# Patient Record
Sex: Female | Born: 1945 | Hispanic: No | Marital: Married | Smoking: Never smoker
Health system: Southern US, Community
[De-identification: ages and names within clinical notes are randomized; demographics above are authoritative.]

## PROBLEM LIST (undated history)

## (undated) DIAGNOSIS — F419 Anxiety disorder, unspecified: Secondary | ICD-10-CM

## (undated) DIAGNOSIS — E079 Disorder of thyroid, unspecified: Secondary | ICD-10-CM

## (undated) DIAGNOSIS — I10 Essential (primary) hypertension: Secondary | ICD-10-CM

---

## 1998-12-14 ENCOUNTER — Other Ambulatory Visit: Admission: RE | Admit: 1998-12-14 | Discharge: 1998-12-14 | Payer: Self-pay | Admitting: Obstetrics and Gynecology

## 2001-07-30 ENCOUNTER — Other Ambulatory Visit: Admission: RE | Admit: 2001-07-30 | Discharge: 2001-07-30 | Payer: Self-pay | Admitting: Obstetrics and Gynecology

## 2001-08-12 ENCOUNTER — Encounter: Admission: RE | Admit: 2001-08-12 | Discharge: 2001-08-12 | Payer: Self-pay | Admitting: Obstetrics and Gynecology

## 2001-08-12 ENCOUNTER — Encounter: Payer: Self-pay | Admitting: Obstetrics and Gynecology

## 2013-01-04 ENCOUNTER — Emergency Department: Payer: Self-pay | Admitting: Emergency Medicine

## 2013-01-04 LAB — CBC WITH DIFFERENTIAL/PLATELET
Basophil #: 0 10*3/uL (ref 0.0–0.1)
Basophil %: 0.6 %
Eosinophil #: 0.1 10*3/uL (ref 0.0–0.7)
Eosinophil %: 1.2 %
HCT: 38.6 % (ref 35.0–47.0)
HGB: 13.4 g/dL (ref 12.0–16.0)
Lymphocyte #: 0.9 10*3/uL — ABNORMAL LOW (ref 1.0–3.6)
Lymphocyte %: 16 %
MCH: 34.7 pg — ABNORMAL HIGH (ref 26.0–34.0)
MCHC: 34.7 g/dL (ref 32.0–36.0)
MCV: 100 fL (ref 80–100)
Monocyte #: 0.5 x10 3/mm (ref 0.2–0.9)
Monocyte %: 9.1 %
Neutrophil #: 4.1 10*3/uL (ref 1.4–6.5)
Neutrophil %: 73.1 %
Platelet: 131 10*3/uL — ABNORMAL LOW (ref 150–440)
RBC: 3.87 10*6/uL (ref 3.80–5.20)
RDW: 13.1 % (ref 11.5–14.5)
WBC: 5.6 10*3/uL (ref 3.6–11.0)

## 2013-01-04 LAB — COMPREHENSIVE METABOLIC PANEL
Albumin: 3.7 g/dL (ref 3.4–5.0)
Alkaline Phosphatase: 73 U/L
Anion Gap: 6 — ABNORMAL LOW (ref 7–16)
BUN: 17 mg/dL (ref 7–18)
Bilirubin,Total: 0.7 mg/dL (ref 0.2–1.0)
Calcium, Total: 10.4 mg/dL — ABNORMAL HIGH (ref 8.5–10.1)
Chloride: 101 mmol/L (ref 98–107)
Co2: 27 mmol/L (ref 21–32)
Creatinine: 0.89 mg/dL (ref 0.60–1.30)
EGFR (African American): >60
EGFR (Non-African Amer.): >60
Glucose: 147 mg/dL — ABNORMAL HIGH (ref 65–99)
Osmolality: 272 (ref 275–301)
Potassium: 3.9 mmol/L (ref 3.5–5.1)
SGOT(AST): 32 U/L (ref 15–37)
SGPT (ALT): 36 U/L (ref 12–78)
Sodium: 134 mmol/L — ABNORMAL LOW (ref 136–145)
Total Protein: 8.3 g/dL — ABNORMAL HIGH (ref 6.4–8.2)

## 2013-01-04 LAB — SEDIMENTATION RATE: Erythrocyte Sed Rate: 69 mm/hr — ABNORMAL HIGH (ref 0–30)

## 2013-01-04 LAB — URIC ACID: Uric Acid: 5 mg/dL (ref 2.6–6.0)

## 2013-01-08 ENCOUNTER — Emergency Department: Payer: Self-pay | Admitting: Emergency Medicine

## 2013-01-08 LAB — CBC
HCT: 40.9 % (ref 35.0–47.0)
HGB: 14.3 g/dL (ref 12.0–16.0)
MCH: 34.7 pg — ABNORMAL HIGH (ref 26.0–34.0)
MCHC: 35.1 g/dL (ref 32.0–36.0)
MCV: 99 fL (ref 80–100)
Platelet: 185 10*3/uL (ref 150–440)
RBC: 4.14 10*6/uL (ref 3.80–5.20)
RDW: 13 % (ref 11.5–14.5)
WBC: 6.2 10*3/uL (ref 3.6–11.0)

## 2013-01-08 LAB — COMPREHENSIVE METABOLIC PANEL
Albumin: 4.1 g/dL (ref 3.4–5.0)
Alkaline Phosphatase: 88 U/L
Anion Gap: 3 — ABNORMAL LOW (ref 7–16)
BUN: 18 mg/dL (ref 7–18)
Bilirubin,Total: 0.5 mg/dL (ref 0.2–1.0)
Calcium, Total: 10.9 mg/dL — ABNORMAL HIGH (ref 8.5–10.1)
Chloride: 104 mmol/L (ref 98–107)
Co2: 28 mmol/L (ref 21–32)
Creatinine: 0.78 mg/dL (ref 0.60–1.30)
EGFR (African American): >60
EGFR (Non-African Amer.): >60
Glucose: 135 mg/dL — ABNORMAL HIGH (ref 65–99)
Osmolality: 274 (ref 275–301)
Potassium: 4.3 mmol/L (ref 3.5–5.1)
SGOT(AST): 62 U/L — ABNORMAL HIGH (ref 15–37)
SGPT (ALT): 61 U/L (ref 12–78)
Sodium: 135 mmol/L — ABNORMAL LOW (ref 136–145)
Total Protein: 9.3 g/dL — ABNORMAL HIGH (ref 6.4–8.2)

## 2013-01-08 LAB — TSH: Thyroid Stimulating Horm: 26.2 u[IU]/mL — ABNORMAL HIGH

## 2014-04-05 DIAGNOSIS — M545 Low back pain: Secondary | ICD-10-CM | POA: Diagnosis not present

## 2014-04-05 DIAGNOSIS — S3992XA Unspecified injury of lower back, initial encounter: Secondary | ICD-10-CM | POA: Diagnosis not present

## 2014-11-25 DIAGNOSIS — Z139 Encounter for screening, unspecified: Secondary | ICD-10-CM | POA: Diagnosis not present

## 2014-11-25 DIAGNOSIS — E119 Type 2 diabetes mellitus without complications: Secondary | ICD-10-CM | POA: Diagnosis not present

## 2014-11-25 DIAGNOSIS — E039 Hypothyroidism, unspecified: Secondary | ICD-10-CM | POA: Diagnosis not present

## 2015-01-19 ENCOUNTER — Encounter (HOSPITAL_COMMUNITY): Payer: Self-pay | Admitting: Emergency Medicine

## 2015-01-19 ENCOUNTER — Emergency Department (HOSPITAL_COMMUNITY)
Admission: EM | Admit: 2015-01-19 | Discharge: 2015-01-19 | Discharge status: Against Medical Advice | Payer: Medicare PPO | Attending: Emergency Medicine | Admitting: Emergency Medicine

## 2015-01-19 DIAGNOSIS — F329 Major depressive disorder, single episode, unspecified: Secondary | ICD-10-CM | POA: Insufficient documentation

## 2015-01-19 DIAGNOSIS — Z8639 Personal history of other endocrine, nutritional and metabolic disease: Secondary | ICD-10-CM | POA: Diagnosis not present

## 2015-01-19 DIAGNOSIS — I10 Essential (primary) hypertension: Secondary | ICD-10-CM | POA: Diagnosis not present

## 2015-01-19 DIAGNOSIS — F1010 Alcohol abuse, uncomplicated: Secondary | ICD-10-CM | POA: Diagnosis not present

## 2015-01-19 DIAGNOSIS — F32A Depression, unspecified: Secondary | ICD-10-CM

## 2015-01-19 HISTORY — DX: Essential (primary) hypertension: I10

## 2015-01-19 HISTORY — DX: Disorder of thyroid, unspecified: E07.9

## 2015-01-19 LAB — COMPREHENSIVE METABOLIC PANEL
ALK PHOS: 63 U/L (ref 38–126)
ALT: 79 U/L — AB (ref 14–54)
AST: 125 U/L — AB (ref 15–41)
Albumin: 4.1 g/dL (ref 3.5–5.0)
Anion gap: 10 (ref 5–15)
BUN: 13 mg/dL (ref 6–20)
CALCIUM: 11 mg/dL — AB (ref 8.9–10.3)
CHLORIDE: 96 mmol/L — AB (ref 101–111)
CO2: 27 mmol/L (ref 22–32)
CREATININE: 0.85 mg/dL (ref 0.44–1.00)
GFR calc non Af Amer: >60 mL/min (ref >60)
Glucose, Bld: 188 mg/dL — ABNORMAL HIGH (ref 65–99)
Potassium: 4.1 mmol/L (ref 3.5–5.1)
SODIUM: 133 mmol/L — AB (ref 135–145)
Total Bilirubin: 0.6 mg/dL (ref 0.3–1.2)
Total Protein: 7.8 g/dL (ref 6.5–8.1)

## 2015-01-19 LAB — CBC
HEMATOCRIT: 42.5 % (ref 36.0–46.0)
Hemoglobin: 14.8 g/dL (ref 12.0–15.0)
MCH: 35.7 pg — AB (ref 26.0–34.0)
MCHC: 34.8 g/dL (ref 30.0–36.0)
MCV: 102.7 fL — AB (ref 78.0–100.0)
Platelets: 122 10*3/uL — ABNORMAL LOW (ref 150–400)
RBC: 4.14 MIL/uL (ref 3.87–5.11)
RDW: 12.8 % (ref 11.5–15.5)
WBC: 4.6 10*3/uL (ref 4.0–10.5)

## 2015-01-19 LAB — ETHANOL: Alcohol, Ethyl (B): <5 mg/dL (ref <5)

## 2015-01-19 MED ORDER — THIAMINE HCL 100 MG/ML IJ SOLN: 100.0000 mg | Freq: Every day | INTRAMUSCULAR | Status: DC

## 2015-01-19 MED ORDER — LORAZEPAM 1 MG PO TABS: 0.0000 mg | ORAL_TABLET | Freq: Two times a day (BID) | ORAL | Status: DC

## 2015-01-19 MED ORDER — VITAMIN B-1 100 MG PO TABS: 100.0000 mg | ORAL_TABLET | Freq: Every day | ORAL | Status: DC

## 2015-01-19 MED ORDER — LORAZEPAM 1 MG PO TABS: 0.0000 mg | ORAL_TABLET | Freq: Four times a day (QID) | ORAL | Status: DC

## 2015-01-19 NOTE — BH Assessment (Signed)
Tele Assessment Note   Whitney BellowsCathy G Wageman is an 69 y.o. female.   Diagnosis:   Past Medical History:  Past Medical History  Diagnosis Date  . Hypertension   . Thyroid disease     History reviewed. No pertinent past surgical history.  Family History: History reviewed. No pertinent family history.  Social History:  reports that she has never smoked. She does not have any smokeless tobacco history on file. She reports that she drinks alcohol. She reports that she does not use illicit drugs.  Additional Social History:  Alcohol / Drug Use History of alcohol / drug use?: Yes Longest period of sobriety (when/how long): Can't remember  Negative Consequences of Use: Financial, Personal relationships, Work / Programmer, multimediachool, Armed forces operational officerLegal Withdrawal Symptoms: Fever / Chills, Irritability, Patient aware of relationship between substance abuse and physical/medical complications, Sweats Substance #1 Name of Substance 1: Alcohol 1 - Age of First Use: 19 1 - Amount (size/oz): half of a bottle of vodka daily 1 - Frequency: Daily  1 - Duration: Last couple of years  1 - Last Use / Amount: Yesterday she drank a half a bottle of vodka   CIWA: CIWA-Ar BP: 140/75 mmHg Pulse Rate: 70 Nausea and Vomiting: mild nausea with no vomiting Tactile Disturbances: none Tremor: not visible, but can be felt fingertip to fingertip Auditory Disturbances: not present Paroxysmal Sweats: no sweat visible Visual Disturbances: not present Anxiety: moderately anxious, or guarded, so anxiety is inferred Headache, Fullness in Head: very mild Agitation: normal activity Orientation and Clouding of Sensorium: oriented and can do serial additions CIWA-Ar Total: 7 COWS:    PATIENT STRENGTHS: (choose at least two) Average or above average intelligence Capable of independent living Communication skills Supportive family/friends  Allergies: No Known Allergies  Home Medications:  (Not in a hospital admission)  OB/GYN Status:  No  LMP recorded.  General Assessment Data [facility] Assessment: In system Is this a Tele or Face-to-Face Assessment?: Tele Assessment Is this an Initial Assessment or a Re-assessment for this encounter?: Initial Assessment Marital status: Married [place] Whitney Gilbert Is patient pregnant?: No Pregnancy Status: No Living Arrangements: Spouse/significant other Can pt return to current living arrangement?: Yes Admission Status: Voluntary Is patient capable of signing voluntary admission?: Yes Referral Source: Self/Family/Friend Insurance type: [employer]  Medical Screening Exam [facility] ONLY) Medical Exam completed: Yes  Crisis Care Plan Living Arrangements: Spouse/significant other Legal Guardian:  (NA) Name of Psychiatrist: None Reported Name of Therapist: None Reported  Education Status Is patient currently in school?: No Current Grade: NA Highest grade of school patient has completed: NA Name of school: NA Contact person: NA  Risk to self with the past 6 months Suicidal Ideation: No Suicidal Intent: No Has patient had any suicidal intent within the past 6 months prior to admission? : No Is patient at risk for suicide?: No Suicidal Plan?: No Has patient had any suicidal plan within the past 6 months prior to admission? : No Access to Means: No What has been your use of drugs/alcohol within the last 12 months?: na Previous Attempts/Gestures: No How many times?: 0 Other Self Harm Risks: na Triggers for Past Attempts: None known Intentional Self Injurious Behavior: None Family Suicide History: No Recent stressful life event(s): Other (Comment) (wANTS TO STOP DRINKING ) Persecutory voices/beliefs?: No Depression: Yes Depression Symptoms: Despondent, Tearfulness, Insomnia, Guilt, Fatigue, Feeling worthless/self pity Substance abuse history and/or treatment for substance abuse?: Yes Suicide prevention information given to non-admitted patients: Not  applicable  Risk to Others within the past 6 months Homicidal Ideation: No Does patient have any lifetime risk of violence toward others beyond the six months prior to admission? : No Thoughts of Harm to Others: No Current Homicidal Intent: No Current Homicidal Plan: No Access to Homicidal Means: No Identified Victim: na History of harm to others?: No Assessment of Violence: None Noted Violent Behavior Description: n Does patient have access to weapons?: No Criminal Charges Pending?: No Does patient have a court date: No Is patient on probation?: No  Psychosis Hallucinations: None noted Delusions: None noted  Mental Status Report Appearance/Hygiene: In hospital gown Eye Contact: Fair Motor Activity: Freedom of movement Speech: Logical/coherent Level of Consciousness: Alert Mood: Depressed Affect: Appropriate to circumstance Anxiety Level: None Thought Processes: Coherent, Relevant Judgement: Unimpaired Orientation: Place, Person, Time, Situation Obsessive Compulsive Thoughts/Behaviors: None  Cognitive Functioning Concentration: Decreased Memory: Recent Intact, Remote Intact IQ: Average Insight: Fair Impulse Control: Poor Appetite: Fair Weight Loss: 0 Weight Gain: 0 Sleep: Decreased Total Hours of Sleep: 4 Vegetative Symptoms: None  ADLScreening [facility] Assessment Services) Patient's cognitive ability adequate to safely complete daily activities?: Yes Patient able to express need for assistance with ADLs?: Yes Independently performs ADLs?: Yes (appropriate for developmental age)  Prior Inpatient Therapy Prior Inpatient Therapy: No Prior Therapy Dates: NA Prior Therapy Facilty/Provider(s): NA Reason for Treatment: NA  Prior Outpatient Therapy Prior Outpatient Therapy: Yes Prior Therapy Dates: 2010 Prior Therapy Facilty/Provider(s): Dr. Raquel James  Reason for Treatment: Medication Management  Does patient have an ACCT team?: No Does patient have Intensive  In-House Services?  : No Does patient have [facility] services? : No Does patient have P4CC services?: No  ADL Screening (condition at time of admission) Patient's cognitive ability adequate to safely complete daily activities?: Yes Is the patient deaf or have difficulty hearing?: No Does the patient have difficulty seeing, even when wearing glasses/contacts?: No Does the patient have difficulty concentrating, remembering, or making decisions?: No Patient able to express need for assistance with ADLs?: Yes Does the patient have difficulty dressing or bathing?: No Independently performs ADLs?: Yes (appropriate for developmental age) Does the patient have difficulty walking or climbing stairs?: No Weakness of Legs: None Weakness of Arms/Hands: None  Home Assistive Devices/Equipment Home Assistive Devices/Equipment: None    Abuse/Neglect Assessment (Assessment to be complete while patient is alone) Physical Abuse: Denies Verbal Abuse: Denies Sexual Abuse: Denies Exploitation of patient/patient's resources: Denies Self-Neglect: Denies Values / Beliefs Cultural Requests During Hospitalization: None Spiritual Requests During Hospitalization: None Consults Spiritual Care Consult Needed: No Social Work Consult Needed: No Merchant navy officer (For Healthcare) Does patient have an advance directive?: No Would patient like information on creating an advanced directive?: No - patient declined information    Additional Information 1:1 In Past 12 Months?: No CIRT Risk: No Elopement Risk: No Does patient have medical clearance?: Yes     Disposition:  Disposition Initial Assessment Completed for this Encounter: Yes  Linton Rump 01/19/2015 3:25 PM

## 2015-01-19 NOTE — ED Notes (Signed)
Ordered carb modified tray for pt.

## 2015-01-19 NOTE — Progress Notes (Signed)
Per May Augustin NP, patient doesn't meet inpatient criteria and to be d/c home. RN Danielle informed.  Whitney Gilbert, LCSWA Disposition staff 01/19/2015 5:53 PM

## 2015-01-19 NOTE — BH Assessment (Signed)
Patient appropriate for discharge per May Agustin, NP.

## 2015-01-19 NOTE — ED Notes (Signed)
Patient brought back to room via wheelchair with family in tow; patient getting undressed and into a gown at this time; visitor at bedside

## 2015-01-19 NOTE — ED Provider Notes (Addendum)
CSN: [id number]     Arrival date & time 01/19/15  1131 History   First MD Initiated Contact with Patient 01/19/15 1237     Chief Complaint  Patient presents with  . Addiction Problem  . Depression     (Consider location/radiation/quality/duration/timing/severity/associated sxs/prior Treatment) HPI Comments: Patient presents to the ED with a chief complaint of alcohol problem and depression.  She states that she wants to quit drinking.  States that she finally reached a tipping point this week.  States that she normally drinks a half a bottle of vodka or 3 bottles of wine per day.  Last drink was yesterday.  Also complains of depression over problems with family/granddaughter.  She denies SI or HI.  Denies any recreational drug use.  She denies any other complaints at this time.  The history is provided by the patient. No language interpreter was used.    Past Medical History  Diagnosis Date  . Hypertension   . Thyroid disease    History reviewed. No pertinent past surgical history. History reviewed. No pertinent family history. Social History  Substance Use Topics  . Smoking status: Never Smoker   . Smokeless tobacco: None  . Alcohol Use: Yes   OB History    No data available     Review of Systems  Constitutional: Negative for fever and chills.  Respiratory: Negative for shortness of breath.   Cardiovascular: Negative for chest pain.  Gastrointestinal: Negative for nausea, vomiting, diarrhea and constipation.  Genitourinary: Negative for dysuria.  All other systems reviewed and are negative.     Allergies  Review of patient's allergies indicates no known allergies.  Home Medications   Prior to Admission medications   Not on File   BP 137/77 mmHg  Pulse 63  Temp(Src) 97.8 F (36.6 C) (Oral)  Resp 18  SpO2 96% Physical Exam  Constitutional: She is oriented to person, place, and time. She appears well-developed and well-nourished.  HENT:  Head: Normocephalic  and atraumatic.  Eyes: Conjunctivae and EOM are normal. Pupils are equal, round, and reactive to light.  Neck: Normal range of motion. Neck supple.  Cardiovascular: Normal rate and regular rhythm.  Exam reveals no gallop and no friction rub.   No murmur heard. Pulmonary/Chest: Effort normal and breath sounds normal. No respiratory distress. She has no wheezes. She has no rales. She exhibits no tenderness.  Abdominal: Soft. Bowel sounds are normal. She exhibits no distension and no mass. There is no tenderness. There is no rebound and no guarding.  Musculoskeletal: Normal range of motion. She exhibits no edema or tenderness.  Neurological: She is alert and oriented to person, place, and time.  Skin: Skin is warm and dry.  Psychiatric: She has a normal mood and affect. Her behavior is normal. Judgment and thought content normal.  Nursing note and vitals reviewed.   ED Course  Procedures (including critical care time) Results for orders placed or performed during the hospital encounter of 01/19/15  Comprehensive metabolic panel  Result Value Ref Range   Sodium 133 (L) 135 - 145 mmol/L   Potassium 4.1 3.5 - 5.1 mmol/L   Chloride 96 (L) 101 - 111 mmol/L   CO2 27 22 - 32 mmol/L   Glucose, Bld 188 (H) 65 - 99 mg/dL   BUN 13 6 - 20 mg/dL   Creatinine, Ser 4.090.85 0.44 - 1.00 mg/dL   Calcium 81.111.0 (H) 8.9 - 10.3 mg/dL   Total Protein 7.8 6.5 - 8.1 g/dL  Albumin 4.1 3.5 - 5.0 g/dL   AST 161 (H) 15 - 41 U/L   ALT 79 (H) 14 - 54 U/L   Alkaline Phosphatase 63 38 - 126 U/L   Total Bilirubin 0.6 0.3 - 1.2 mg/dL   GFR calc non Af Amer >60 >60 mL/min   GFR calc Af Amer >60 >60 mL/min   Anion gap 10 5 - 15  Ethanol (ETOH)  Result Value Ref Range   Alcohol, Ethyl (B) <5 <5 mg/dL  CBC  Result Value Ref Range   WBC 4.6 4.0 - 10.5 K/uL   RBC 4.14 3.87 - 5.11 MIL/uL   Hemoglobin 14.8 12.0 - 15.0 g/dL   HCT 09.6 04.5 - 40.9 %   MCV 102.7 (H) 78.0 - 100.0 fL   MCH 35.7 (H) 26.0 - 34.0 pg   MCHC  34.8 30.0 - 36.0 g/dL   RDW 81.1 91.4 - 78.2 %   Platelets 122 (L) 150 - 400 K/uL   No results found.  I have personally reviewed and evaluated these images and lab results as part of my medical decision-making.   MDM   Final diagnoses:  Depression    Patient here for detox from alcohol.  Drinks heavily, a half bottle of vodka or 3 wine bottles per day.  No SI/HI.  No drug use.  Medically clear.  Last drink yesterday.  TTS consult pending.    Roxy Horseman, PA-C 01/19/15 1516  Tilden Fossa, MD 01/20/15 [id number]  Roxy Horseman, PA-C 02/07/15 1630  Tilden Fossa, MD 02/11/15 [phone]

## 2015-01-19 NOTE — ED Notes (Signed)
Pt daughter came for visiting hours and was told by Security that she was not able to have all of her personal items in the room during visiting time.  Pt and daughter became frustrated stating she felt like her mom was in jail and refused to leave her for the night without being able to stay.  This RN tried to explain that this unit had different rules and Pt became angry, cussing at RN and shaking her finger in RNs face stating that she was leaving.  RN tried to offer pt some ativan per CIWA protocal to help her but she continued to cuss and raise voice, grabbing her books off nursing station and walking out in her gown with her RN.  MD and charge RN made aware.

## 2015-01-19 NOTE — ED Notes (Signed)
Pt sts increased anxiety and depression but denies SI/HI; pt tearful at present; pt with hx of heavy ETOH abuse but sts attempting to decrease amount; pt has increased stress; pt not taking psych meds per norm

## 2015-01-20 ENCOUNTER — Emergency Department
Admission: EM | Admit: 2015-01-20 | Discharge: 2015-01-20 | Disposition: A | Discharge status: Home / Self Care | Payer: Medicare PPO | Attending: Emergency Medicine | Admitting: Emergency Medicine

## 2015-01-20 DIAGNOSIS — F329 Major depressive disorder, single episode, unspecified: Secondary | ICD-10-CM | POA: Insufficient documentation

## 2015-01-20 DIAGNOSIS — F1010 Alcohol abuse, uncomplicated: Secondary | ICD-10-CM | POA: Insufficient documentation

## 2015-01-20 DIAGNOSIS — F419 Anxiety disorder, unspecified: Secondary | ICD-10-CM | POA: Insufficient documentation

## 2015-01-20 DIAGNOSIS — F32A Depression, unspecified: Secondary | ICD-10-CM

## 2015-01-20 DIAGNOSIS — Z79899 Other long term (current) drug therapy: Secondary | ICD-10-CM | POA: Diagnosis not present

## 2015-01-20 DIAGNOSIS — F418 Other specified anxiety disorders: Secondary | ICD-10-CM | POA: Diagnosis not present

## 2015-01-20 DIAGNOSIS — I10 Essential (primary) hypertension: Secondary | ICD-10-CM | POA: Insufficient documentation

## 2015-01-20 MED ORDER — LORAZEPAM 0.5 MG PO TABS: 0.5000 mg | ORAL_TABLET | Freq: Two times a day (BID) | ORAL | Status: AC

## 2015-01-20 MED ORDER — LORAZEPAM 1 MG PO TABS
1.0000 mg | ORAL_TABLET | Freq: Once | ORAL | Status: AC
  Administered 2015-01-20: 1 mg via ORAL
  Filled 2015-01-20: qty 1

## 2015-01-20 MED ORDER — FLUOXETINE HCL 20 MG PO CAPS: 20.0000 mg | ORAL_CAPSULE | Freq: Every day | ORAL | Status: AC

## 2015-01-20 NOTE — ED Notes (Signed)
Patient given instructions to follow-up with [facility] walk-in clinic today.  Patient verbalized understanding.

## 2015-01-20 NOTE — ED Notes (Signed)
BEHAVIORAL HEALTH ROUNDING Patient sleeping: No. Patient alert and oriented: yes Behavior appropriate: Yes.  ; If no, describe:  Nutrition and fluids offered: Yes  Toileting and hygiene offered: Yes  Sitter present: q 15 min checks Law enforcement present: Yes  

## 2015-01-20 NOTE — Discharge Instructions (Signed)
° °Generalized Anxiety Disorder °Generalized anxiety disorder (GAD) is a mental disorder. It interferes with life functions, including relationships, work, and school. °GAD is different from normal anxiety, which everyone experiences at some point in their lives in response to specific life events and activities. Normal anxiety actually helps us prepare for and get through these life events and activities. Normal anxiety goes away after the event or activity is over.  °GAD causes anxiety that is not necessarily related to specific events or activities. It also causes excess anxiety in proportion to specific events or activities. The anxiety associated with GAD is also difficult to control. GAD can vary from mild to severe. People with severe GAD can have intense waves of anxiety with physical symptoms (panic attacks).  °SYMPTOMS °The anxiety and worry associated with GAD are difficult to control. This anxiety and worry are related to many life events and activities and also occur more days than not for 6 months or longer. People with GAD also have three or more of the following symptoms (one or more in children): °· Restlessness.   °· Fatigue. °· Difficulty concentrating.   °· Irritability. °· Muscle tension. °· Difficulty sleeping or unsatisfying sleep. °DIAGNOSIS °GAD is diagnosed through an assessment by your health care provider. Your health care provider will ask you questions about your mood, physical symptoms, and events in your life. Your health care provider may ask you about your medical history and use of alcohol or drugs, including prescription medicines. Your health care provider may also do a physical exam and blood tests. Certain medical conditions and the use of certain substances can cause symptoms similar to those associated with GAD. Your health care provider may refer you to a mental health specialist for further evaluation. °TREATMENT °The following therapies are usually used to treat GAD:   °· Medication. Antidepressant medication usually is prescribed for long-term daily control. Antianxiety medicines may be added in severe cases, especially when panic attacks occur.   °· Talk therapy (psychotherapy). Certain types of talk therapy can be helpful in treating GAD by providing support, education, and guidance. A form of talk therapy called cognitive behavioral therapy can teach you healthy ways to think about and react to daily life events and activities. °· Stress management techniques. These include yoga, meditation, and exercise and can be very helpful when they are practiced regularly. °A mental health specialist can help determine which treatment is best for you. Some people see improvement with one therapy. However, other people require a combination of therapies. °  °This information is not intended to replace advice given to you by your health care provider. Make sure you discuss any questions you have with your health care provider. °  °Document Released: 05/19/2012 Document Revised: 02/12/2014 Document Reviewed: 05/19/2012 °Elsevier Interactive Patient Education ©2016 [employer] Inc[employer] ° °Major Depressive Disorder °Major depressive disorder is a mental illness. It also may be called clinical depression or unipolar depression. Major depressive disorder usually causes feelings of sadness, hopelessness, or helplessness. Some people with this disorder do not feel particularly sad but lose interest in doing things they used to enjoy (anhedonia). Major depressive disorder also can cause physical symptoms. It can interfere with work, school, relationships, and other normal everyday activities. The disorder varies in severity but is longer lasting and more serious than the sadness we all feel from time to time in our lives. °Major depressive disorder often is triggered by stressful life events or major life changes. Examples of these triggers include divorce, loss of your job or home,   a move, and the  death of a family member or close friend. Sometimes this disorder occurs for no obvious reason at all. People who have family members with major depressive disorder or bipolar disorder are at higher risk for developing this disorder, with or without life stressors. Major depressive disorder can occur at any age. It may occur just once in your life (single episode major depressive disorder). It may occur multiple times (recurrent major depressive disorder). °SYMPTOMS °People with major depressive disorder have either anhedonia or depressed mood on nearly a daily basis for at least 2 weeks or longer. Symptoms of depressed mood include: °· Feelings of sadness (blue or down in the dumps) or emptiness. °· Feelings of hopelessness or helplessness. °· Tearfulness or episodes of crying (may be observed by others). °· Irritability (children and adolescents). °In addition to depressed mood or anhedonia or both, people with this disorder have at least four of the following symptoms: °· Difficulty sleeping or sleeping too much.   °· Significant change (increase or decrease) in appetite or weight.   °· Lack of energy or motivation. °· Feelings of guilt and worthlessness.   °· Difficulty concentrating, remembering, or making decisions. °· Unusually slow movement (psychomotor retardation) or restlessness (as observed by others).   °· Recurrent wishes for death, recurrent thoughts of self-harm (suicide), or a suicide attempt. °People with major depressive disorder commonly have persistent negative thoughts about themselves, other people, and the world. People with severe major depressive disorder may experience distorted beliefs or perceptions about the world (psychotic delusions). They also may see or hear things that are not real (psychotic hallucinations). °DIAGNOSIS °Major depressive disorder is diagnosed through an assessment by your health care provider. Your health care provider will ask about aspects of your daily life,  such as mood, sleep, and appetite, to see if you have the diagnostic symptoms of major depressive disorder. Your health care provider may ask about your medical history and use of alcohol or drugs, including prescription medicines. Your health care provider also may do a physical exam and blood work. This is because certain medical conditions and the use of certain substances can cause major depressive disorder-like symptoms (secondary depression). Your health care provider also may refer you to a mental health specialist for further evaluation and treatment. °TREATMENT °It is important to recognize the symptoms of major depressive disorder and seek treatment. The following treatments can be prescribed for this disorder:   °· Medicine. Antidepressant medicines usually are prescribed. Antidepressant medicines are thought to correct chemical imbalances in the brain that are commonly associated with major depressive disorder. Other types of medicine may be added if the symptoms do not respond to antidepressant medicines alone or if psychotic delusions or hallucinations occur. °· Talk therapy. Talk therapy can be helpful in treating major depressive disorder by providing support, education, and guidance. Certain types of talk therapy also can help with negative thinking (cognitive behavioral therapy) and with relationship issues that trigger this disorder (interpersonal therapy). °A mental health specialist can help determine which treatment is best for you. Most people with major depressive disorder do well with a combination of medicine and talk therapy. Treatments involving electrical stimulation of the brain can be used in situations with extremely severe symptoms or when medicine and talk therapy do not work over time. These treatments include electroconvulsive therapy, transcranial magnetic stimulation, and vagal nerve stimulation. °  °This information is not intended to replace advice given to you by your health  care provider. Make sure you discuss any questions you have   with your health care provider. °  °Document Released: 05/19/2012 Document Revised: 02/12/2014 Document Reviewed: 05/19/2012 °Elsevier Interactive Patient Education ©2016 [employer]vier Inc. ° °

## 2015-01-20 NOTE — ED Provider Notes (Signed)
[facility] Emergency Department Provider Note  ____________________________________________  Time seen: 7:10 AM  I have reviewed the triage vital signs and the nursing notes.   HISTORY  Chief Complaint Addiction Problem    HPI Whitney Gilbert is a 69 y.o. female who requests alcohol detox. She states that she normally drinks about half a bottle of vodka or a few bottles of wine daily, but also tends to skip days and not drink every day.Denies a history of hallucinosis or seizure or DTs. When she doesn't drink, she just gets more anxious and has trouble sleeping. When she stopped drinking most recently, it took 5 days for her to start developing anxiety symptoms.  She was seen in the [facility] emergency Department yesterday for the same issue. After a prolonged and extensive evaluation, she eventually went home after being frustrated with the amount of time spent in the emergency room completing her evaluation. She denies any SI or HI or hallucinations. Last drink was 2 days ago.  The patient used to take Prozac 40 mg daily that was given to her by a psychiatrist. However, when the psychiatrist retired many years ago, she then started getting the Prozac from her primary care doctor but then eventually did not follow-up and stopped taking Prozac altogether. It was about 2 years ago that she stopped taking Prozac. Since then, she has felt more anxious and had trouble sleeping, and that is when she started drinking. She feels like the drinking replaces her Prozac. She can tell that her same symptoms that the Prozac and improved have reoccurred, and she feels that if she starts taking Prozac again she will feel much better. She is highly motivated to stop drinking. Her symptoms have been improved with Ativan that she used to take in the past, but she has not had that refilled in several months. She took her last 4 tablets for a total of 2 mg last night, and was able to sleep  after that.     Past Medical History  Diagnosis Date  . Hypertension   . Thyroid disease      There are no active problems to display for this patient.    No past surgical history on file.   Current Outpatient Rx  Name  Route  Sig  Dispense  Refill  . hydrochlorothiazide (HYDRODIURIL) 25 MG tablet   Oral   Take 25 mg by mouth daily.         Marland Kitchen. levothyroxine (SYNTHROID, LEVOTHROID) 137 MCG tablet   Oral   Take 137 mcg by mouth daily before breakfast.         . lisinopril (PRINIVIL,ZESTRIL) 40 MG tablet   Oral   Take 40 mg by mouth daily.         Marland Kitchen. LORazepam (ATIVAN) 0.5 MG tablet   Oral   Take 0.5 mg by mouth every 8 (eight) hours.            Allergies Review of patient's allergies indicates no known allergies.   No family history on file.  Social History Social History  Substance Use Topics  . Smoking status: Never Smoker   . Smokeless tobacco: Not on file  . Alcohol Use: Yes    Review of Systems  Constitutional:   No fever or chills. No weight changes Eyes:   No blurry vision or double vision.  ENT:   No sore throat. Cardiovascular:   No chest pain. Respiratory:   No dyspnea or cough. Gastrointestinal:  Negative for abdominal pain, vomiting and diarrhea.  No BRBPR or melena. Genitourinary:   Negative for dysuria, urinary retention, bloody urine, or difficulty urinating. Musculoskeletal:   Negative for back pain. No joint swelling or pain. Skin:   Negative for rash. Neurological:   Negative for headaches, focal weakness or numbness. Psychiatric:  Positive anxiety and depression.  Endocrine:  No hot/cold intolerance, changes in energy, or sleep difficulty.  10-point ROS otherwise negative.  ____________________________________________   PHYSICAL EXAM:  VITAL SIGNS: ED Triage Vitals  Enc Vitals Group     BP 01/20/15 0619 145/83 mmHg     Pulse Rate 01/20/15 0619 81     Resp 01/20/15 0619 18     Temp 01/20/15 0619 97.9 F (36.6  C)     Temp Source 01/20/15 0619 Oral     SpO2 01/20/15 0619 96 %     Weight 01/20/15 0619 200 lb (90.719 kg)     Height 01/20/15 0619  (1.676 m)     Head Cir --      Peak Flow --      Pain Score --      Pain Loc --      Pain Edu? --      Excl. in GC? --      Constitutional:   Alert and oriented. Well appearing and in no distress. Eyes:   No scleral icterus. No conjunctival pallor. PERRL. EOMI ENT   Head:   Normocephalic and atraumatic.   Nose:   No congestion/rhinnorhea. No septal hematoma   Mouth/Throat:   MMM, no pharyngeal erythema. No peritonsillar mass. No uvula shift.   Neck:   No stridor. No SubQ emphysema. No meningismus. Hematological/Lymphatic/Immunilogical:   No cervical lymphadenopathy. Cardiovascular:   RRR heart rate 70. Normal and symmetric distal pulses are present in all extremities. No murmurs, rubs, or gallops. Respiratory:   Normal respiratory effort without tachypnea nor retractions. Breath sounds are clear and equal bilaterally. No wheezes/rales/rhonchi. Gastrointestinal:   Soft and nontender. No distention. There is no CVA tenderness.  No rebound, rigidity, or guarding. Genitourinary:   deferred Musculoskeletal:   Nontender with normal range of motion in all extremities. No joint effusions.  No lower extremity tenderness.  No edema. Neurologic:   Normal speech and language.  CN 2-10 normal. Motor grossly intact. No pronator drift.  Normal gait. No tremor or fasciculations No gross focal neurologic deficits are appreciated.  Skin:    Skin is warm, dry and intact. No rash noted.  No petechiae, purpura, or bullae. Psychiatric:   Mood and affect are normal. Speech and behavior are normal. Patient exhibits appropriate insight and judgment.  ____________________________________________    LABS (pertinent positives/negatives) (all labs ordered are listed, but only abnormal results are displayed) Labs Reviewed - No data to  display ____________________________________________   EKG    ____________________________________________    RADIOLOGY    ____________________________________________   PROCEDURES   ____________________________________________   INITIAL IMPRESSION / ASSESSMENT AND PLAN / ED COURSE  Pertinent labs & imaging results that were available during my care of the patient were reviewed by me and considered in my medical decision making (see chart for details).  Patient is well-appearing no acute distress. No evidence of withdrawal at this time. Vital signs are stable and overall unremarkable. Patient reports taking her medications as prescribed for hypertension and hypothyroidism. It seems that her predominant problem is worsened anxiety and depression after discontinuation of Prozac a few years ago. She does not appear  to need any significant detox at this time. I requested our behavioral medicine social worker to come speak with the patient about resources, and they'll plan to restart the patient on her Prozac and have her follow-up with her primary care doctor. I'll also provide a limited prescription of benzodiazepines to take as needed for anxiety. The patient is highly motivated, very informed about her own illnesses and medical care, and seems very reliable about taking medications as prescribed and following up.  ----------------------------------------- 9:23 AM on 01/20/2015 -----------------------------------------  Patient seen by TTS for outpatient resources and information. We'll discharge, follow up with primary care in about 2 weeks.   ____________________________________________   FINAL CLINICAL IMPRESSION(S) / ED DIAGNOSES  Final diagnoses:  Depression  Anxiety      Sharman Cheek, MD 01/20/15 [phone]

## 2015-01-20 NOTE — ED Notes (Signed)
Pt here for alcohol detox, denies any other substance abuse.  No SI or HI was at [facility] yest and discharged home.

## 2015-02-23 DIAGNOSIS — E039 Hypothyroidism, unspecified: Secondary | ICD-10-CM | POA: Diagnosis not present

## 2015-02-23 DIAGNOSIS — Z139 Encounter for screening, unspecified: Secondary | ICD-10-CM | POA: Diagnosis not present

## 2015-02-23 DIAGNOSIS — I10 Essential (primary) hypertension: Secondary | ICD-10-CM | POA: Diagnosis not present

## 2015-02-23 DIAGNOSIS — Z0000 Encounter for general adult medical examination without abnormal findings: Secondary | ICD-10-CM | POA: Diagnosis not present

## 2015-06-16 DIAGNOSIS — E039 Hypothyroidism, unspecified: Secondary | ICD-10-CM | POA: Diagnosis not present

## 2015-06-17 DIAGNOSIS — I10 Essential (primary) hypertension: Secondary | ICD-10-CM | POA: Diagnosis not present

## 2015-06-17 DIAGNOSIS — Z139 Encounter for screening, unspecified: Secondary | ICD-10-CM | POA: Diagnosis not present

## 2015-06-17 DIAGNOSIS — B159 Hepatitis A without hepatic coma: Secondary | ICD-10-CM | POA: Diagnosis not present

## 2015-06-17 DIAGNOSIS — E119 Type 2 diabetes mellitus without complications: Secondary | ICD-10-CM | POA: Diagnosis not present

## 2015-10-26 DIAGNOSIS — I10 Essential (primary) hypertension: Secondary | ICD-10-CM | POA: Diagnosis not present

## 2015-10-26 DIAGNOSIS — E7800 Pure hypercholesterolemia, unspecified: Secondary | ICD-10-CM | POA: Diagnosis not present

## 2015-10-26 DIAGNOSIS — E119 Type 2 diabetes mellitus without complications: Secondary | ICD-10-CM | POA: Diagnosis not present

## 2015-10-26 DIAGNOSIS — E039 Hypothyroidism, unspecified: Secondary | ICD-10-CM | POA: Diagnosis not present

## 2015-12-02 ENCOUNTER — Ambulatory Visit (INDEPENDENT_AMBULATORY_CARE_PROVIDER_SITE_OTHER): Payer: Medicare PPO

## 2015-12-02 ENCOUNTER — Ambulatory Visit (INDEPENDENT_AMBULATORY_CARE_PROVIDER_SITE_OTHER): Payer: Medicare PPO | Admitting: Podiatry

## 2015-12-02 ENCOUNTER — Encounter: Payer: Self-pay | Admitting: Podiatry

## 2015-12-02 VITALS — BP 169/106 | HR 65

## 2015-12-02 DIAGNOSIS — M779 Enthesopathy, unspecified: Secondary | ICD-10-CM

## 2015-12-02 DIAGNOSIS — M778 Other enthesopathies, not elsewhere classified: Secondary | ICD-10-CM

## 2015-12-02 DIAGNOSIS — M79672 Pain in left foot: Secondary | ICD-10-CM

## 2015-12-02 DIAGNOSIS — M109 Gout, unspecified: Secondary | ICD-10-CM | POA: Diagnosis not present

## 2015-12-02 DIAGNOSIS — M7752 Other enthesopathy of left foot: Secondary | ICD-10-CM

## 2015-12-02 MED ORDER — COLCHICINE 0.6 MG PO TABS: 0.6000 mg | ORAL_TABLET | Freq: Every day | ORAL | 0 refills | Status: DC

## 2015-12-02 NOTE — Progress Notes (Signed)
   Subjective:    Patient ID: Whitney Gilbert, female    DOB: Sep 13, 1945, 70 y.o.   MRN: [id number]  HPI    Review of Systems  Musculoskeletal: Positive for back pain.  Hematological: Bruises/bleeds easily.  All other systems reviewed and are negative.      Objective:   Physical Exam        Assessment & Plan:

## 2015-12-03 LAB — CBC WITH DIFFERENTIAL/PLATELET
BASOS ABS: 0 10*3/uL (ref 0.0–0.2)
Basos: 1 %
EOS (ABSOLUTE): 0.2 10*3/uL (ref 0.0–0.4)
Eos: 3 %
HEMOGLOBIN: 13.5 g/dL (ref 11.1–15.9)
Hematocrit: 37.8 % (ref 34.0–46.6)
Immature Grans (Abs): 0 10*3/uL (ref 0.0–0.1)
Immature Granulocytes: 0 %
LYMPHS ABS: 1.8 10*3/uL (ref 0.7–3.1)
Lymphs: 31 %
MCH: 34.4 pg — ABNORMAL HIGH (ref 26.6–33.0)
MCHC: 35.7 g/dL (ref 31.5–35.7)
MCV: 96 fL (ref 79–97)
MONOCYTES: 9 %
Monocytes Absolute: 0.5 10*3/uL (ref 0.1–0.9)
NEUTROS PCT: 56 %
Neutrophils Absolute: 3.2 10*3/uL (ref 1.4–7.0)
Platelets: 201 10*3/uL (ref 150–379)
RBC: 3.92 x10E6/uL (ref 3.77–5.28)
RDW: 12.1 % — AB (ref 12.3–15.4)
WBC: 5.6 10*3/uL (ref 3.4–10.8)

## 2015-12-03 LAB — URIC ACID: URIC ACID: 8.7 mg/dL — AB (ref 2.5–7.1)

## 2015-12-06 ENCOUNTER — Ambulatory Visit: Payer: Medicare PPO | Admitting: Podiatry

## 2015-12-11 MED ORDER — BETAMETHASONE SOD PHOS & ACET 6 (3-3) MG/ML IJ SUSP
3.0000 mg | Freq: Once | INTRAMUSCULAR | Status: AC
Start: 2015-12-11 — End: ?

## 2015-12-11 NOTE — Progress Notes (Signed)
Patient ID: Whitney Gilbert, female   DOB: 01-25-1946, 70 y.o.   MRN: [id number] Subjective:  Patient presents today for pain and tenderness to the left foot. Patient states that the foot has been red, swollen, and painful for the last few days. He denies trauma Patient presents today for her treatment and evaluation    Objective/Physical Exam General: The patient is alert and oriented x3 in no acute distress.  Dermatology: Skin is warm, dry and supple bilateral lower extremities. Negative for open lesions or macerations.  Vascular: Edema, erythema noted to the distal aspects of the left foot level MPJs 3-4. Palpable pedal pulses bilaterally. No edema or erythema noted. Capillary refill within normal limits.  Neurological: Epicritic and protective threshold grossly intact bilaterally.   Musculoskeletal Exam: Pain on palpation and range of motion noted to MPJs 3-for left foot. Range of motion within normal limits to all pedal and ankle joints bilateral. Muscle strength 5/5 in all groups bilateral.   Radiographic Exam:  Normal osseous mineralization. Joint spaces preserved. No fracture/dislocation/boney destruction.    Assessment: #1 possible acute gout attack left foot #2 capsulitis MPJs 3-for left foot #3 pain in left foot   Plan of Care:  #1 Patient was evaluated. #2 injection of 0.5 mL Celestone Soluspan injected into the third MPJ left foot #3 prescription for colchicine 0.6 mg 3 #4 orders for uric acid levels placed #5 return to clinic in 2 weeks   Dr. Felecia ShellingBrent M. Evans, DPM [facility]

## 2015-12-20 ENCOUNTER — Ambulatory Visit (INDEPENDENT_AMBULATORY_CARE_PROVIDER_SITE_OTHER): Payer: Medicare PPO | Admitting: Podiatry

## 2015-12-20 DIAGNOSIS — M109 Gout, unspecified: Secondary | ICD-10-CM | POA: Diagnosis not present

## 2015-12-20 DIAGNOSIS — M79672 Pain in left foot: Secondary | ICD-10-CM

## 2015-12-20 DIAGNOSIS — M7752 Other enthesopathy of left foot: Secondary | ICD-10-CM | POA: Diagnosis not present

## 2015-12-20 DIAGNOSIS — M778 Other enthesopathies, not elsewhere classified: Secondary | ICD-10-CM

## 2015-12-20 DIAGNOSIS — M779 Enthesopathy, unspecified: Secondary | ICD-10-CM

## 2016-01-01 NOTE — Progress Notes (Signed)
Patient ID: Whitney BellowsCathy G Gilbert, female   DOB: [birth year], 70 y.o.   MRN: [id number] Subjective:  Patient presents today for follow-up evaluation of pain and tenderness to the left foot. Patient states that the foot has greatly improved. He denies trauma Patient presents today for her treatment and evaluation    Objective/Physical Exam General: The patient is alert and oriented x3 in no acute distress.  Dermatology: Skin is warm, dry and supple bilateral lower extremities. Negative for open lesions or macerations.  Vascular: Edema, erythema noted to the distal aspects of the left foot level MPJs 3-4 - improved. Palpable pedal pulses bilaterally. No edema or erythema noted. Capillary refill within normal limits.  Neurological: Epicritic and protective threshold grossly intact bilaterally.   Musculoskeletal Exam: Minimal pain on palpation and range of motion noted to MPJs 3-for left foot. Range of motion within normal limits to all pedal and ankle joints bilateral. Muscle strength 5/5 in all groups bilateral.   Radiographic Exam:  Normal osseous mineralization. Joint spaces preserved. No fracture/dislocation/boney destruction.    Assessment: #1 possible acute gout attack left foot -  improved #2 capsulitis MPJs 3-for left foot - improved #3 pain in left foot   Plan of Care:  #1 Patient was evaluated. #2 uric acid levels reviewed which were elevated consistent with acute gout attack.  #3 Return to clinic PRN.    Dr. Felecia ShellingBrent M. Naleah Kofoed, DPM [facility]

## 2016-02-15 DIAGNOSIS — I10 Essential (primary) hypertension: Secondary | ICD-10-CM | POA: Diagnosis not present

## 2016-02-15 DIAGNOSIS — E7800 Pure hypercholesterolemia, unspecified: Secondary | ICD-10-CM | POA: Diagnosis not present

## 2016-02-15 DIAGNOSIS — F4321 Adjustment disorder with depressed mood: Secondary | ICD-10-CM | POA: Diagnosis not present

## 2016-02-15 DIAGNOSIS — E039 Hypothyroidism, unspecified: Secondary | ICD-10-CM | POA: Diagnosis not present

## 2016-03-14 DIAGNOSIS — Z1231 Encounter for screening mammogram for malignant neoplasm of breast: Secondary | ICD-10-CM | POA: Diagnosis not present

## 2016-03-14 DIAGNOSIS — R928 Other abnormal and inconclusive findings on diagnostic imaging of breast: Secondary | ICD-10-CM | POA: Diagnosis not present

## 2016-04-03 DIAGNOSIS — R928 Other abnormal and inconclusive findings on diagnostic imaging of breast: Secondary | ICD-10-CM | POA: Diagnosis not present

## 2016-04-03 DIAGNOSIS — N6489 Other specified disorders of breast: Secondary | ICD-10-CM | POA: Diagnosis not present

## 2016-04-18 ENCOUNTER — Telehealth: Payer: Self-pay | Admitting: *Deleted

## 2016-04-18 MED ORDER — COLCHICINE 0.6 MG PO TABS: 0.6000 mg | ORAL_TABLET | Freq: Every day | ORAL | 0 refills | Status: DC

## 2016-04-18 NOTE — Telephone Encounter (Signed)
Pt is scheduled to see Dr Logan BoresEvans on 3.20.18 and asked if she could get a refill to last until that appt. She said her foot is swollen and she cannot hardly were a shoe

## 2016-04-18 NOTE — Telephone Encounter (Addendum)
Pt states she now has gout in her other foot and would like a refill of the 3 pills she got in 11/2015. I told her I would ask Dr. Logan BoresEvans, that he had wanted her to make an appt if it occurred again, pt agreed. I informed pt Dr. Philomena DohenyEvans okayed refill of the Colchicine 0.6mg  #3.

## 2016-04-19 NOTE — Telephone Encounter (Signed)
Yes, please order colchicine 0.6mg  QD #10 no refills. Dr. Logan BoresEvans

## 2016-04-20 ENCOUNTER — Ambulatory Visit (INDEPENDENT_AMBULATORY_CARE_PROVIDER_SITE_OTHER): Payer: Medicare PPO | Admitting: Podiatry

## 2016-04-20 ENCOUNTER — Ambulatory Visit (INDEPENDENT_AMBULATORY_CARE_PROVIDER_SITE_OTHER): Payer: Medicare PPO

## 2016-04-20 ENCOUNTER — Encounter: Payer: Self-pay | Admitting: Podiatry

## 2016-04-20 DIAGNOSIS — M778 Other enthesopathies, not elsewhere classified: Secondary | ICD-10-CM

## 2016-04-20 DIAGNOSIS — M779 Enthesopathy, unspecified: Secondary | ICD-10-CM

## 2016-04-20 DIAGNOSIS — M109 Gout, unspecified: Secondary | ICD-10-CM | POA: Diagnosis not present

## 2016-04-20 DIAGNOSIS — M7751 Other enthesopathy of right foot: Secondary | ICD-10-CM | POA: Diagnosis not present

## 2016-04-20 DIAGNOSIS — M7752 Other enthesopathy of left foot: Secondary | ICD-10-CM | POA: Diagnosis not present

## 2016-04-20 MED ORDER — COLCHICINE 0.6 MG PO TABS: 0.6000 mg | ORAL_TABLET | Freq: Every day | ORAL | 1 refills | Status: DC

## 2016-04-24 ENCOUNTER — Ambulatory Visit: Payer: Medicare PPO | Admitting: Podiatry

## 2016-04-30 MED ORDER — BETAMETHASONE SOD PHOS & ACET 6 (3-3) MG/ML IJ SUSP: 3.0000 mg | Freq: Once | INTRAMUSCULAR | Status: AC

## 2016-04-30 NOTE — Progress Notes (Signed)
   Subjective:  Patient presents today for severe pain and tenderness to the right foot that started 04/09/2016. Patient states that she see 42 times this week. Patient denies trauma. Patient does have a history of gout.    Objective/Physical Exam General: The patient is alert and oriented x3 in no acute distress.  Dermatology: Skin is warm, dry and supple bilateral lower extremities. Negative for open lesions or macerations.  Vascular: Palpable pedal pulses bilaterally. No edema or erythema noted. Capillary refill within normal limits.  Neurological: Epicritic and protective threshold grossly intact bilaterally.   Musculoskeletal Exam: Significant pain on palpation noted to the second and third MPJ right foot. There is erythema with edema noted diffusely throughout the right forefoot.  Radiographic Exam:  Normal osseous mineralization. Joint spaces preserved. No fracture/dislocation/boney destruction.    Assessment: #1 acute gout attack 2nd/3rd MPJ right foot   Plan of Care:  #1 Patient was evaluated. X-rays reviewed #2 discussed the importance of controlling diet and avoiding certain foods such as red wine, red meat, seafood #3 injection of 0.5 mL Celestone Soluspan injected into the second and third MPJ right foot #4 prescription for Colcrys 0.6 mg #5 return to clinic in 4 weeks   Felecia ShellingBrent M. Evans, DPM [facility]  Dr. Felecia ShellingBrent M. Evans, DPM    [address]                                        [place], [place] [address]                Office [phone]  Fax [phone]

## 2016-06-19 DIAGNOSIS — I10 Essential (primary) hypertension: Secondary | ICD-10-CM | POA: Diagnosis not present

## 2016-06-19 DIAGNOSIS — E039 Hypothyroidism, unspecified: Secondary | ICD-10-CM | POA: Diagnosis not present

## 2016-06-19 DIAGNOSIS — E119 Type 2 diabetes mellitus without complications: Secondary | ICD-10-CM | POA: Diagnosis not present

## 2016-06-19 DIAGNOSIS — F4321 Adjustment disorder with depressed mood: Secondary | ICD-10-CM | POA: Diagnosis not present

## 2016-06-19 DIAGNOSIS — E7800 Pure hypercholesterolemia, unspecified: Secondary | ICD-10-CM | POA: Diagnosis not present

## 2016-06-22 DIAGNOSIS — R5383 Other fatigue: Secondary | ICD-10-CM | POA: Diagnosis not present

## 2016-06-22 DIAGNOSIS — E039 Hypothyroidism, unspecified: Secondary | ICD-10-CM | POA: Diagnosis not present

## 2016-06-22 DIAGNOSIS — E119 Type 2 diabetes mellitus without complications: Secondary | ICD-10-CM | POA: Diagnosis not present

## 2016-06-22 DIAGNOSIS — R110 Nausea: Secondary | ICD-10-CM | POA: Diagnosis not present

## 2017-07-09 ENCOUNTER — Encounter: Payer: Self-pay | Admitting: Podiatry

## 2017-07-09 ENCOUNTER — Ambulatory Visit: Payer: Medicare PPO | Admitting: Podiatry

## 2017-07-09 DIAGNOSIS — M659 Synovitis and tenosynovitis, unspecified: Secondary | ICD-10-CM

## 2017-07-09 DIAGNOSIS — M778 Other enthesopathies, not elsewhere classified: Secondary | ICD-10-CM

## 2017-07-09 DIAGNOSIS — M779 Enthesopathy, unspecified: Secondary | ICD-10-CM

## 2017-07-09 MED ORDER — METHYLPREDNISOLONE 4 MG PO TBPK: ORAL_TABLET | ORAL | 0 refills | Status: DC

## 2017-07-09 MED ORDER — MELOXICAM 15 MG PO TABS: 15.0000 mg | ORAL_TABLET | Freq: Every day | ORAL | 1 refills | Status: AC

## 2017-07-11 NOTE — Progress Notes (Signed)
   HPI: 72 year old female presenting today with a chief complaint of a gout flare up of the left foot and ankle that began two weeks ago. She reports associated swelling of the areas. Walking increases the pain. She has taken Colchicine for the past 5-6 days with no significant relief. Patient is here for further evaluation and treatment.   Past Medical History:  Diagnosis Date  . Hypertension   . Thyroid disease      Physical Exam: General: The patient is alert and oriented x3 in no acute distress.  Dermatology: Skin is warm, dry and supple bilateral lower extremities. Negative for open lesions or macerations.  Vascular: Palpable pedal pulses bilaterally. Capillary refill within normal limits.  Neurological: Epicritic and protective threshold grossly intact bilaterally.   Musculoskeletal Exam: Pain on palpation to the left ankle and midfoot with erythema and edema. Range of motion within normal limits to all pedal and ankle joints bilateral. Muscle strength 5/5 in all groups bilateral.   Assessment: 1. Acute gout left ankle and midfoot   Plan of Care:  1. Patient evaluated.  2. Injection of 0.5 mLs Celestone Soluspan injected into the left ankle joint. 3. Injection of 0.5 mLs Celestone Soluspan injected into the left midfoot.  4. Prescription for Medrol Dose Pak provided to patient.  5. Prescription for Meloxicam provided to patient.  6. Return to clinic in 4 weeks.       Felecia ShellingBrent M. Evans, DPM [facility]  Dr. Felecia ShellingBrent M. Evans, DPM    [address]                                        [place], [place] [address]                Office [phone]  Fax [phone]

## 2017-08-06 ENCOUNTER — Ambulatory Visit: Payer: Medicare PPO | Admitting: Podiatry

## 2018-06-27 ENCOUNTER — Emergency Department (HOSPITAL_COMMUNITY): Payer: Medicare PPO

## 2018-06-27 ENCOUNTER — Emergency Department (HOSPITAL_COMMUNITY)
Admission: EM | Admit: 2018-06-27 | Discharge: 2018-06-27 | Disposition: A | Discharge status: Home / Self Care | Payer: Medicare PPO | Attending: Emergency Medicine | Admitting: Emergency Medicine

## 2018-06-27 ENCOUNTER — Other Ambulatory Visit: Payer: Self-pay

## 2018-06-27 ENCOUNTER — Encounter (HOSPITAL_COMMUNITY): Payer: Self-pay | Admitting: Emergency Medicine

## 2018-06-27 DIAGNOSIS — R0602 Shortness of breath: Secondary | ICD-10-CM | POA: Diagnosis present

## 2018-06-27 DIAGNOSIS — I10 Essential (primary) hypertension: Secondary | ICD-10-CM | POA: Insufficient documentation

## 2018-06-27 DIAGNOSIS — Z79899 Other long term (current) drug therapy: Secondary | ICD-10-CM | POA: Insufficient documentation

## 2018-06-27 HISTORY — DX: Anxiety disorder, unspecified: F41.9

## 2018-06-27 LAB — BASIC METABOLIC PANEL
Anion gap: 13 (ref 5–15)
BUN: 18 mg/dL (ref 8–23)
CO2: 20 mmol/L — ABNORMAL LOW (ref 22–32)
Calcium: 10.1 mg/dL (ref 8.9–10.3)
Chloride: 103 mmol/L (ref 98–111)
Creatinine, Ser: 1.09 mg/dL — ABNORMAL HIGH (ref 0.44–1.00)
GFR calc Af Amer: 60 mL/min — ABNORMAL LOW (ref >60)
GFR calc non Af Amer: 51 mL/min — ABNORMAL LOW (ref >60)
Glucose, Bld: 270 mg/dL — ABNORMAL HIGH (ref 70–99)
Potassium: 4.1 mmol/L (ref 3.5–5.1)
Sodium: 136 mmol/L (ref 135–145)

## 2018-06-27 LAB — CBC WITH DIFFERENTIAL/PLATELET
Abs Immature Granulocytes: 0.02 10*3/uL (ref 0.00–0.07)
Basophils Absolute: 0 10*3/uL (ref 0.0–0.1)
Basophils Relative: 1 %
Eosinophils Absolute: 0.1 10*3/uL (ref 0.0–0.5)
Eosinophils Relative: 3 %
HCT: 37.1 % (ref 36.0–46.0)
Hemoglobin: 13.2 g/dL (ref 12.0–15.0)
Immature Granulocytes: 1 %
Lymphocytes Relative: 35 %
Lymphs Abs: 1.1 10*3/uL (ref 0.7–4.0)
MCH: 37.2 pg — ABNORMAL HIGH (ref 26.0–34.0)
MCHC: 35.6 g/dL (ref 30.0–36.0)
MCV: 104.5 fL — ABNORMAL HIGH (ref 80.0–100.0)
Monocytes Absolute: 0.2 10*3/uL (ref 0.1–1.0)
Monocytes Relative: 6 %
Neutro Abs: 1.7 10*3/uL (ref 1.7–7.7)
Neutrophils Relative %: 54 %
Platelets: 98 10*3/uL — ABNORMAL LOW (ref 150–400)
RBC: 3.55 MIL/uL — ABNORMAL LOW (ref 3.87–5.11)
RDW: 12.6 % (ref 11.5–15.5)
WBC: 3.2 10*3/uL — ABNORMAL LOW (ref 4.0–10.5)
nRBC: 0 % (ref 0.0–0.2)

## 2018-06-27 LAB — TROPONIN I: Troponin I: <0.03 ng/mL (ref <0.03)

## 2018-06-27 NOTE — ED Notes (Signed)
Patient verbalized understanding of dc instructions, vss, ambulatory with nad.   

## 2018-06-27 NOTE — ED Provider Notes (Signed)
TIME SEEN: 3:30 AM  CHIEF COMPLAINT: Shortness of breath  HPI: Patient is a 73 year old female with history of hypertension, hypothyroidism, anxiety who presents to the emergency department with shortness of breath.  Patient states that she went to bed tonight feeling fine and then woke up from sleep feeling short of breath.  States she got up to go watch television and the symptoms progressively worsened.  She stated that she started to feel anxious because she felt so short of breath.  She had no chest pain or chest discomfort.  No fevers, cough, lower extremity swelling or pain.  States her blood pressure at home was very elevated which also concerned her.  States symptoms have improved and now her breathing is back to her baseline.  She does not wear oxygen chronically.  She has no history of asthma, COPD, CHF, PE or DVT.  ROS: See HPI Constitutional: no fever  Eyes: no drainage  ENT: no runny nose   Cardiovascular:  no chest pain  Resp:  SOB  GI: no vomiting GU: no dysuria Integumentary: no rash  Allergy: no hives  Musculoskeletal: no leg swelling  Neurological: no slurred speech ROS otherwise negative  PAST MEDICAL HISTORY/PAST SURGICAL HISTORY:  Past Medical History:  Diagnosis Date  . Anxiety   . Hypertension   . Thyroid disease     MEDICATIONS:  Prior to Admission medications   Medication Sig Start Date End Date Taking? Authorizing Provider  colchicine 0.6 MG tablet Take 1 tablet (0.6 mg total) by mouth daily. 04/20/16   Felecia ShellingEvans, Brent M, DPM  FLUoxetine (PROZAC) 20 MG capsule Take 1 capsule (20 mg total) by mouth daily. 01/20/15 01/20/16  Sharman CheekStafford, Phillip, MD  FLUoxetine (PROZAC) 40 MG capsule Take by mouth daily. 05/07/17   [provider]  hydrochlorothiazide (HYDRODIURIL) 25 MG tablet Take 25 mg by mouth daily.    [provider]  levocetirizine (XYZAL) 5 MG tablet Take 5 mg by mouth daily. 05/07/17   [provider]  levothyroxine (SYNTHROID,  LEVOTHROID) 137 MCG tablet Take 137 mcg by mouth daily before breakfast.    [provider]  levothyroxine (SYNTHROID, LEVOTHROID) 25 MCG tablet Take 25 mcg by mouth daily. 05/16/17   [provider]  lisinopril (PRINIVIL,ZESTRIL) 40 MG tablet Take 40 mg by mouth daily.    [provider]  methylPREDNISolone (MEDROL DOSEPAK) 4 MG TBPK tablet 6 day dose pack - take as directed 07/09/17   Felecia ShellingEvans, Brent M, DPM    ALLERGIES:  No Known Allergies  SOCIAL HISTORY:  Social History   Tobacco Use  . Smoking status: Never Smoker  . Smokeless tobacco: Never Used  Substance Use Topics  . Alcohol use: Yes    FAMILY HISTORY: No family history on file.  EXAM: BP (!) 171/82   Pulse 74   Temp 98.4 F (36.9 C) (Oral)   Resp 20   SpO2 99%  CONSTITUTIONAL: Alert and oriented and responds appropriately to questions. Well-appearing; well-nourished, elderly, obese HEAD: Normocephalic EYES: Conjunctivae clear, pupils appear equal, EOMI ENT: normal nose; moist mucous membranes NECK: Supple, no meningismus, no nuchal rigidity, no LAD  CARD: RRR; S1 and S2 appreciated; no murmurs, no clicks, no rubs, no gallops RESP: Normal chest excursion without splinting or tachypnea; breath sounds clear and equal bilaterally; no wheezes, no rhonchi, no rales, no hypoxia or respiratory distress, speaking full sentences ABD/GI: Normal bowel sounds; non-distended; soft, non-tender, no rebound, no guarding, no peritoneal signs, no hepatosplenomegaly BACK:  The back  appears normal and is non-tender to palpation, there is no CVA tenderness EXT: Normal ROM in all joints; non-tender to palpation; no edema; normal capillary refill; no cyanosis, no calf tenderness or swelling    SKIN: Normal color for age and race; warm; no rash NEURO: Moves all extremities equally PSYCH: The patient's mood and manner are appropriate. Grooming and personal hygiene are appropriate.  MEDICAL DECISION MAKING: Patient  here with shortness of breath, hypertension that has improved.  Blood pressure now 166/91.  She is no longer feeling short of breath.  Her lungs are clear and she has no respiratory distress or hypoxia.  No chest pain or chest discomfort.  EKG does show some mild lateral ST depression but there is significant baseline wander.  We will repeat this EKG.  We have no old for comparison.  Will obtain labs including troponin and a chest x-ray.  Will monitor here in the ED.  ED PROGRESS: Reports that she is still feeling great has no further symptoms.  Her work-up here is unremarkable other than mild hyperglycemia without DKA.  Troponin negative.  Chest x-ray clear.  We did discuss that this episode could have been related to anxiety and that we do not see any life-threatening process present today.  I have low suspicion for PE given she has not tachycardic, tachypneic or hypoxic and is no longer symptomatic.  No signs of volume overload, pneumonia.  Doubt ACS.  She did not have any chest discomfort tonight.  She is comfortable with plan for discharge home with follow-up with her PCP as an outpatient.   At this time, I do not feel there is any life-threatening condition present. I have reviewed and discussed all results (EKG, imaging, lab, urine as appropriate) and exam findings with patient/family. I have reviewed nursing notes and appropriate previous records.  I feel the patient is safe to be discharged home without further emergent workup and can continue workup as an outpatient as needed. Discussed usual and customary return precautions. Patient/family verbalize understanding and are comfortable with this plan.  Outpatient follow-up has been provided as needed. All questions have been answered.      EKG Interpretation  Date/Time:  Friday Jun 27 2018 03:24:39 EDT Ventricular Rate:  70 PR Interval:    QRS Duration: 97 QT Interval:  413 QTC Calculation: 446 R Axis:   45 Text Interpretation:  Sinus  rhythm Minimal ST depression, lateral leads No old tracing to compare Confirmed by Ferrell Claiborne, Baxter Hire [phone]) on 06/27/2018 3:30:19 AM         EKG Interpretation  Date/Time:  Friday Jun 27 2018 03:59:44 EDT Ventricular Rate:  74 PR Interval:    QRS Duration: 91 QT Interval:  410 QTC Calculation: 455 R Axis:   37 Text Interpretation:  Sinus rhythm Lateral changes no longer present Confirmed by Krissy Orebaugh, Baxter Hire [phone]) on 06/27/2018 4:01:30 AM         Ayomikun Starling, Layla Maw, DO 06/27/18 [id number]

## 2018-06-27 NOTE — Discharge Instructions (Addendum)
Your labs, EKG and chest x-ray today were normal.  Please follow-up closely with your primary care physician.  If you have return of any symptoms including shortness of breath, chest pain, dizziness, sudden sweating, feel like you are going to pass out or you do pass out, please return to the emergency department immediately.

## 2018-06-27 NOTE — ED Triage Notes (Signed)
Patient arrives via [facility] from home for c/o sob. Patient reports history of anxiety, has been out of meds x2 weeks. Ems reports patient was up watching information about covid 19 when she began feeling like she could not take a deep breath-thinks she may be having an anxiety attack. Pt a/ox4, resp e/u, nad. Ems reports initial bp of 210/118 now down to 194/110. Pt afebrile, denies any cough, fevers, chills.

## 2018-12-26 ENCOUNTER — Ambulatory Visit (INDEPENDENT_AMBULATORY_CARE_PROVIDER_SITE_OTHER): Payer: Medicare PPO | Admitting: Podiatry

## 2018-12-26 ENCOUNTER — Other Ambulatory Visit: Payer: Self-pay

## 2018-12-26 ENCOUNTER — Encounter: Payer: Self-pay | Admitting: Podiatry

## 2018-12-26 ENCOUNTER — Ambulatory Visit (INDEPENDENT_AMBULATORY_CARE_PROVIDER_SITE_OTHER): Payer: Medicare PPO

## 2018-12-26 DIAGNOSIS — M109 Gout, unspecified: Secondary | ICD-10-CM | POA: Diagnosis not present

## 2018-12-26 DIAGNOSIS — M778 Other enthesopathies, not elsewhere classified: Secondary | ICD-10-CM | POA: Diagnosis not present

## 2018-12-26 DIAGNOSIS — M7751 Other enthesopathy of right foot: Secondary | ICD-10-CM

## 2018-12-26 MED ORDER — MELOXICAM 15 MG PO TABS: 15.0000 mg | ORAL_TABLET | Freq: Every day | ORAL | 1 refills | Status: AC

## 2018-12-26 MED ORDER — METHYLPREDNISOLONE 4 MG PO TBPK: ORAL_TABLET | ORAL | 0 refills | Status: DC

## 2018-12-26 MED ORDER — ALLOPURINOL 100 MG PO TABS: 100.0000 mg | ORAL_TABLET | Freq: Every day | ORAL | 6 refills | Status: AC

## 2019-01-04 NOTE — Progress Notes (Signed)
   HPI: 73 y.o. female presenting today with a chief complaint of severe pain to the right foot that began about one week ago. She reports associated swelling of the foot. She thinks she is having a gout exacerbation and is having difficulty walking secondary to pain. She has been taking Meloxicam and Indomethacin for treatment. Patient is here for further evaluation and treatment.   Past Medical History:  Diagnosis Date  . Anxiety   . Hypertension   . Thyroid disease      Physical Exam: General: The patient is alert and oriented x3 in no acute distress.  Dermatology: Skin is warm, dry and supple bilateral lower extremities. Negative for open lesions or macerations.  Vascular: Palpable pedal pulses bilaterally. No edema or erythema noted. Capillary refill within normal limits.  Neurological: Epicritic and protective threshold grossly intact bilaterally.   Musculoskeletal Exam: Pain on palpation to the right ankle and midfoot with erythema and edema. Range of motion within normal limits to all pedal and ankle joints bilateral. Muscle strength 5/5 in all groups bilateral.   Radiographic Exam:  Normal osseous mineralization. Joint spaces preserved. No fracture/dislocation/boney destruction.    Assessment: 1. Acute gout right ankle and midfoot    Plan of Care:  1. Patient evaluated. X-Rays reviewed.  2. Injection of 0.5 mLs Celestone Soluspan injected into the right ankle joint. 3. Injection of 0.5 mLs Celestone Soluspan injected into the right midfoot.  4. Prescription for Medrol Dose Pak provided to patient.  5. Prescription for Meloxicam provided to patient.  6. Prescription for Allopurinol 100 mg daily provided to patient.  7. Return to clinic in 4 weeks.      Edrick Kins, DPM [facility]  Dr. Edrick Kins, DPM    [address], [place] [address]                Office [phone]  Fax [phone]

## 2019-01-23 ENCOUNTER — Ambulatory Visit: Payer: Medicare PPO | Admitting: Podiatry

## 2020-02-17 ENCOUNTER — Encounter: Payer: Self-pay | Admitting: Podiatry

## 2020-02-17 ENCOUNTER — Ambulatory Visit: Payer: Medicare PPO | Admitting: Podiatry

## 2020-02-17 ENCOUNTER — Other Ambulatory Visit: Payer: Self-pay

## 2020-02-17 DIAGNOSIS — M10472 Other secondary gout, left ankle and foot: Secondary | ICD-10-CM

## 2020-02-17 DIAGNOSIS — M109 Gout, unspecified: Secondary | ICD-10-CM

## 2020-02-17 MED ORDER — TRIAMCINOLONE ACETONIDE 40 MG/ML IJ SUSP
10.0000 mg | Freq: Once | INTRAMUSCULAR | Status: AC
  Administered 2020-02-17: 10 mg

## 2020-02-17 MED ORDER — COLCHICINE 0.6 MG PO TABS: ORAL_TABLET | ORAL | 2 refills | Status: DC

## 2020-02-17 MED ORDER — DEXAMETHASONE SODIUM PHOSPHATE 4 MG/ML IJ SOLN
4.0000 mg | Freq: Once | INTRAMUSCULAR | Status: AC
  Administered 2020-02-17: 4 mg

## 2020-02-17 NOTE — Progress Notes (Signed)
  Subjective:  Patient ID: Whitney Gilbert, female    DOB: 1945-09-05,  MRN: [id number]  Chief Complaint  Patient presents with  . Foot Pain    Patient presents today with possible gout flare up bilat feet x 4-5 days.  She says they sting and burn really bad and are tender to touch.  She has been taking Indomethacin with no relief    75 y.o. female presents with the above complaint. History confirmed with patient. She previously has seen Dr Logan Bores for this. Flare started a few days ago. Went [place] the oyster bar with her husband and noted this worsening after.  Objective:  Physical Exam: warm, good capillary refill, no trophic changes or ulcerative lesions, normal DP and PT pulses and normal sensory exam.  Bilaterally she has diffuse edema and pain on palpation over the dorsal midfoot.  She has significant pain and edema over the left first metatarsal joint and this is painful with range of motion.  No other joints are painful with range of motion. Assessment:   1. Gouty arthritis of right foot   2. Acute gout due to other secondary cause involving toe of left foot      Plan:  Patient was evaluated and treated and all questions answered.  Discussed etiology and treatment for acute and chronic gout.  Low purine free diet plan was given to her.  I recommended oral treatment with colchicine to get her out of this flare.  Prescription was sent to her pharmacy.  She should discuss with her primary care doctor about chronic management if this keeps being a recurrent issue.  Given the pain and edema about the left first metatarsophalangeal joint I recommended intra-articular corticosteroid joint injection..  Following sterile prep with Betadine alcohol I injected the left first metatarsophalangeal joint with 0.5 cc 2% lidocaine, 10 mg of Kenalog and 2 mg of dexamethasone.  She tolerated procedure well.  Return if symptoms worsen or fail to improve.

## 2020-02-17 NOTE — Patient Instructions (Signed)

## 2020-06-28 ENCOUNTER — Other Ambulatory Visit: Payer: Self-pay | Admitting: Podiatry

## 2020-06-28 ENCOUNTER — Telehealth: Payer: Self-pay

## 2020-06-28 MED ORDER — COLCHICINE 0.6 MG PO TABS: ORAL_TABLET | ORAL | 3 refills | Status: AC

## 2020-06-28 NOTE — Telephone Encounter (Signed)
Patient's husband Jillyn Hidden called stated that patient  is having a severe gout flare up right foot and knee and he can't get her out of the house to bring her to the doctor. He requested a refill on her Colchicine   He says she cannot bare any weight on her foot and has been taking some Indomethacin that he had on hand.  It is not giving any relief.  I verbally spoke with Dr. Logan Bores regarding call and he stated that 3 refills can be sent to her pharmacy.   Husband has been made aware of refill and informed to call office tomorrow to schedule follow up appt.

## 2020-06-28 NOTE — Telephone Encounter (Signed)
Please advise 

## 2020-07-12 ENCOUNTER — Ambulatory Visit (INDEPENDENT_AMBULATORY_CARE_PROVIDER_SITE_OTHER): Payer: Medicare PPO | Admitting: Podiatry

## 2020-07-12 ENCOUNTER — Ambulatory Visit (INDEPENDENT_AMBULATORY_CARE_PROVIDER_SITE_OTHER): Payer: Medicare PPO

## 2020-07-12 ENCOUNTER — Other Ambulatory Visit: Payer: Self-pay

## 2020-07-12 DIAGNOSIS — M2041 Other hammer toe(s) (acquired), right foot: Secondary | ICD-10-CM

## 2020-07-12 DIAGNOSIS — M7751 Other enthesopathy of right foot: Secondary | ICD-10-CM | POA: Diagnosis not present

## 2020-07-12 DIAGNOSIS — M19071 Primary osteoarthritis, right ankle and foot: Secondary | ICD-10-CM | POA: Diagnosis not present

## 2020-07-12 DIAGNOSIS — M205X1 Other deformities of toe(s) (acquired), right foot: Secondary | ICD-10-CM | POA: Diagnosis not present

## 2020-07-12 MED ORDER — GABAPENTIN 100 MG PO CAPS: 100.0000 mg | ORAL_CAPSULE | Freq: Three times a day (TID) | ORAL | 3 refills | Status: AC

## 2020-07-12 MED ORDER — BETAMETHASONE SOD PHOS & ACET 6 (3-3) MG/ML IJ SUSP: 3.0000 mg | Freq: Once | INTRAMUSCULAR | Status: AC

## 2020-07-12 NOTE — Progress Notes (Signed)
HPI: 75 y.o. female PMHx chronic alcohol substance abuse presenting today with her daughter for evaluation of pain and tenderness to the right foot.  Patient does have a history of acute gout onset.  She states that last week the pain was very unbearable.  Over the course of the weekend the pain did resolve and she was taking colchicine.  She continues to have some tenderness to the ankle joint however.  Patient also complains of a symptomatic bunion deformity to the right foot as well as hammertoe that has been exacerbated by chronic gout.  She states it is very painful in shoes and she has tried different shoe gear modifications with minimal improvement.  Her hammertoe to the right second digit rubs the top of her shoes and she also gets rubbing from her bunion deformity.  She would like to have it addressed and corrected for since she has dealt with this pain for several years  Past Medical History:  Diagnosis Date  . Anxiety   . Hypertension   . Thyroid disease      Physical Exam: General: The patient is alert and oriented x3 in no acute distress.  Dermatology: Skin is warm, dry and supple bilateral lower extremities. Negative for open lesions or macerations.  Vascular: Palpable pedal pulses bilaterally.  Mild edema and erythema noted to the right ankle.  Capillary refill within normal limits.  Neurological: Epicritic and protective threshold diminished bilaterally.   Musculoskeletal Exam: Pain on palpation and range of motion to the first MTPJ of the right foot.  There is also a rigid hammertoe contracture of the PIPJ and DIPJ of the right second digit.  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  Today there is also pain on palpation to the anterior medial and lateral aspects of the right ankle joint consistent with acute gout flareup.  Mild associated edema and erythema as well  Radiographic Exam:  Normal osseous mineralization.   Degenerative changes noted to the first MTPJ mostly with periarticular callus and bone spur formation.  May suggest chronic gouty tophi.  Hammertoe contracture also noted to the second digit of the right foot.  Assessment: 1.  Acute gout/capsulitis right ankle 2.  Hallux limitus/DJD right first MTPJ 3.  Hammertoe contracture second digit right 4.  Alcohol induced neuropathy bilateral lower extremities   Plan of Care:  1. Patient evaluated. X-Rays reviewed.  2.  Injection of 0.5 cc Celestone Soluspan injected into the right ankle joint 3.  Continue colchicine as needed 4.  Prescription for gabapentin 100 mg TID 5. Today we discussed the conservative versus surgical management of the presenting pathology. The patient opts for surgical management. All possible complications and details of the procedure were explained. All patient questions were answered. No guarantees were expressed or implied. 6. Authorization for surgery was initiated today. Surgery will consist of cheilectomy right first MTPJ.  PIPJ and DIPJ arthroplasty with MTPJ capsulotomy second right 7.  Return to clinic 1 week postop        Felecia Shelling, DPM [facility]  Dr. Felecia Shelling, DPM    [address], [place] [address]  Office [phone]  Fax [phone]

## 2020-07-19 ENCOUNTER — Other Ambulatory Visit: Payer: Self-pay | Admitting: Podiatry

## 2020-07-19 ENCOUNTER — Telehealth: Payer: Self-pay | Admitting: *Deleted

## 2020-07-19 MED ORDER — OXYCODONE-ACETAMINOPHEN 5-325 MG PO TABS: 1.0000 | ORAL_TABLET | Freq: Three times a day (TID) | ORAL | 0 refills | Status: AC | PRN

## 2020-07-19 NOTE — Telephone Encounter (Signed)
Rx percocet sent to the pharmacy. - Dr. Logan Bores

## 2020-07-19 NOTE — Telephone Encounter (Signed)
"  My wife was there last Tuesday.  She's not doing any better.  I believe she's worse.  She has been in the bed for 5 weeks.  She can't put any weight on her foot.  Does Dr. Logan Bores have any suggestions?"

## 2020-07-20 NOTE — Telephone Encounter (Signed)
Patient has been notified of medication and instructed that if she continues to have a lot of pain to call office for an appt.  She verbalized understanding

## 2020-08-12 ENCOUNTER — Encounter: Payer: Self-pay | Admitting: Podiatry

## 2020-08-12 ENCOUNTER — Other Ambulatory Visit: Payer: Self-pay

## 2020-08-12 ENCOUNTER — Ambulatory Visit (INDEPENDENT_AMBULATORY_CARE_PROVIDER_SITE_OTHER): Payer: Medicare PPO | Admitting: Podiatry

## 2020-08-12 DIAGNOSIS — M7752 Other enthesopathy of left foot: Secondary | ICD-10-CM | POA: Diagnosis not present

## 2020-08-12 DIAGNOSIS — M109 Gout, unspecified: Secondary | ICD-10-CM

## 2020-08-12 MED ORDER — BETAMETHASONE SOD PHOS & ACET 6 (3-3) MG/ML IJ SUSP
3.0000 mg | Freq: Once | INTRAMUSCULAR | Status: AC
  Administered 2020-08-12: 3 mg via INTRA_ARTICULAR

## 2020-08-12 NOTE — Progress Notes (Signed)
HPI: 75 y.o. female PMHx chronic alcohol substance abuse presenting today with her daughter for evaluation of pain and tenderness to the right foot.  Patient does have a history of acute gout onset.  She states that last week the pain was very unbearable.  Over the course of the weekend the pain did resolve and she was taking colchicine.  She continues to have some tenderness to the ankle joint however.  Patient also complains of a symptomatic bunion deformity to the right foot as well as hammertoe that has been exacerbated by chronic gout.  She states it is very painful in shoes and she has tried different shoe gear modifications with minimal improvement.  Her hammertoe to the right second digit rubs the top of her shoes and she also gets rubbing from her bunion deformity.  She would like to have it addressed and corrected for since she has dealt with this pain for several years  Today the patient is complaining of pain and tenderness to the left first MTPJ.  She experiences pain and tenderness with swelling to the area.  She would like to have it evaluated with possible injection.  Injections have been very successful in alleviating her pain temporarily.  She presents for further treatment and evaluation  Past Medical History:  Diagnosis Date   Anxiety    Hypertension    Thyroid disease      Physical Exam: General: The patient is alert and oriented x3 in no acute distress.  Dermatology: Skin is warm, dry and supple bilateral lower extremities. Negative for open lesions or macerations.  Vascular: Palpable pedal pulses bilaterally.  Mild edema and erythema noted to the right ankle.  Capillary refill within normal limits.  Neurological: Epicritic and protective threshold diminished bilaterally.   Musculoskeletal Exam: Pain on palpation and range of motion to the first MTPJ of the bilateral feet.  There is also a rigid hammertoe contracture of the PIPJ and DIPJ of the right second digit.  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  There continues to be pain on palpation to the anterior medial and lateral aspects of the right ankle joint consistent with acute gout flareup.  Mild associated edema and erythema as well    Radiographic Exam:  Normal osseous mineralization.  Degenerative changes noted to the first MTPJ mostly with periarticular callus and bone spur formation.  May suggest chronic gouty tophi.  Hammertoe contracture also noted to the second digit of the right foot.  Assessment: 1.  Acute gout/capsulitis right ankle 2.  Hallux limitus/DJD right first MTPJ 3.  Hammertoe contracture second digit right 4.  Alcohol induced neuropathy bilateral lower extremities 5.  First MTPJ capsulitis left   Plan of Care:  1. Patient evaluated. X-Rays reviewed.  2.  Injection of 0.5 cc Celestone Soluspan injected into the first MTPJ left 3.  Continue management with her PCP and rheumatology. 4.  Continue colchicine and allopurinol as per prescribing physician 5.  At the moment we are going to wait on any surgical intervention at this time and see if the patient gets any alleviation with medical management through rheumatology and her PCP  6.  Return to clinic in 6 months  *Presented today with her husband        Whitney Gilbert, DPM [facility]  Dr. Felecia Gilbert, DPM    [address]  [place], [place] [address]                Office [phone]  Fax [phone]

## 2020-09-06 ENCOUNTER — Other Ambulatory Visit
Admission: RE | Admit: 2020-09-06 | Discharge: 2020-09-06 | Disposition: A | Discharge status: Home / Self Care | Payer: Medicare PPO | Source: Ambulatory Visit | Attending: Rheumatology | Admitting: Rheumatology

## 2020-09-06 DIAGNOSIS — M1009 Idiopathic gout, multiple sites: Secondary | ICD-10-CM | POA: Insufficient documentation

## 2020-09-06 DIAGNOSIS — M25561 Pain in right knee: Secondary | ICD-10-CM | POA: Diagnosis present

## 2020-09-06 DIAGNOSIS — G8929 Other chronic pain: Secondary | ICD-10-CM | POA: Diagnosis not present

## 2020-09-06 LAB — SYNOVIAL CELL COUNT + DIFF, W/ CRYSTALS
Eosinophils-Synovial: 0 %
Lymphocytes-Synovial Fld: 54 %
Monocyte-Macrophage-Synovial Fluid: 22 %
Neutrophil, Synovial: 24 %
WBC, Synovial: 696 /mm3 — ABNORMAL HIGH (ref 0–200)

## 2020-11-03 ENCOUNTER — Other Ambulatory Visit: Payer: Self-pay | Admitting: Neurology

## 2020-11-03 DIAGNOSIS — R251 Tremor, unspecified: Secondary | ICD-10-CM

## 2020-11-25 ENCOUNTER — Ambulatory Visit: Payer: Medicare PPO

## 2020-11-28 ENCOUNTER — Other Ambulatory Visit: Payer: Self-pay

## 2020-11-28 ENCOUNTER — Ambulatory Visit
Admission: RE | Admit: 2020-11-28 | Discharge: 2020-11-28 | Disposition: A | Discharge status: Home / Self Care | Payer: Medicare PPO | Source: Ambulatory Visit | Attending: Neurology | Admitting: Neurology

## 2020-11-28 DIAGNOSIS — R251 Tremor, unspecified: Secondary | ICD-10-CM | POA: Diagnosis present

## 2020-12-14 DIAGNOSIS — R202 Paresthesia of skin: Secondary | ICD-10-CM | POA: Diagnosis not present

## 2020-12-21 DIAGNOSIS — E039 Hypothyroidism, unspecified: Secondary | ICD-10-CM | POA: Diagnosis not present

## 2021-01-05 DIAGNOSIS — R42 Dizziness and giddiness: Secondary | ICD-10-CM | POA: Diagnosis not present

## 2021-01-05 DIAGNOSIS — R202 Paresthesia of skin: Secondary | ICD-10-CM | POA: Diagnosis not present

## 2021-01-05 DIAGNOSIS — G629 Polyneuropathy, unspecified: Secondary | ICD-10-CM | POA: Diagnosis not present

## 2021-01-08 ENCOUNTER — Other Ambulatory Visit: Payer: Self-pay

## 2021-01-08 ENCOUNTER — Encounter: Payer: Self-pay | Admitting: Emergency Medicine

## 2021-01-08 ENCOUNTER — Emergency Department
Admission: EM | Admit: 2021-01-08 | Discharge: 2021-01-08 | Disposition: A | Discharge status: Home / Self Care | Payer: Medicare PPO | Attending: Emergency Medicine | Admitting: Emergency Medicine

## 2021-01-08 DIAGNOSIS — S61217A Laceration without foreign body of left little finger without damage to nail, initial encounter: Secondary | ICD-10-CM | POA: Insufficient documentation

## 2021-01-08 DIAGNOSIS — Z79899 Other long term (current) drug therapy: Secondary | ICD-10-CM | POA: Diagnosis not present

## 2021-01-08 DIAGNOSIS — I10 Essential (primary) hypertension: Secondary | ICD-10-CM | POA: Insufficient documentation

## 2021-01-08 DIAGNOSIS — S6992XA Unspecified injury of left wrist, hand and finger(s), initial encounter: Secondary | ICD-10-CM | POA: Diagnosis present

## 2021-01-08 DIAGNOSIS — W272XXA Contact with scissors, initial encounter: Secondary | ICD-10-CM | POA: Insufficient documentation

## 2021-01-08 MED ORDER — TETANUS-DIPHTH-ACELL PERTUSSIS 5-2.5-18.5 LF-MCG/0.5 IM SUSY
0.5000 mL | PREFILLED_SYRINGE | Freq: Once | INTRAMUSCULAR | Status: DC
  Filled 2021-01-08: qty 0.5

## 2021-01-08 MED ORDER — LIDOCAINE HCL (PF) 1 % IJ SOLN
5.0000 mL | Freq: Once | INTRAMUSCULAR | Status: DC
  Filled 2021-01-08: qty 5

## 2021-01-08 MED ORDER — LIDOCAINE-EPINEPHRINE-TETRACAINE (LET) TOPICAL GEL
3.0000 mL | Freq: Once | TOPICAL | Status: DC
  Filled 2021-01-08: qty 3

## 2021-01-08 NOTE — ED Notes (Signed)
This RN attempted to administer TDAP booster as patient cannot remember their last booster. Pt asking about possible side effects and this RN informed patient that soreness at injection site is possible and patient stated "I can barely lift my arms already,I cannot have a sore arm for the next 5 days." This RN informed pt that there are alternative injection sites on thighs or glute muscle if they would prefer those. Pt stated "I refuse... I have been seeing doctors for the next 6 months and I am sick of getting "treatments" that dont work. I had an infection at [facility] and they hit the wrong vein and my arm swelled up, but hey that wasn't their fault..." This RN Educated the patient on the implications for giving the TDAP booster to prevent Tetanus post laceration. The patient endorsed "If I decide I want it I can just get it from my PCP, they live across the street" Pt asking  for someone to come look at their laceration "I just want someone to look at this cut that's been bleeding for the last 2 hours." This RN informed the patient that a provider would be in at their earliest convenience.

## 2021-01-08 NOTE — ED Provider Notes (Signed)
[facility] Emergency Department Provider Note ____________________________________________  Time seen: 1421  I have reviewed the triage vital signs and the nursing notes.  HISTORY  Chief Complaint  Laceration  HPI Whitney Gilbert is a 75 y.o. female presents to the ED for evaluation of accidental laceration to the left pinky.  Patient was using a pair of scissors, when she accidentally sliced the skin of the left pinky.  She presents after she was unable to get the wound to stop bleeding.  She denies any use of any blood thinners at this time.  She denies any other injury at this time.  She is unclear of her tetanus status, but will follow-up with her PCP for tetanus update.  Past Medical History:  Diagnosis Date   Anxiety    Hypertension    Thyroid disease     There are no problems to display for this patient.   History reviewed. No pertinent surgical history.  Prior to Admission medications   Medication Sig Start Date End Date Taking? Authorizing Provider  allopurinol (ZYLOPRIM) 100 MG tablet Take 1 tablet (100 mg total) by mouth daily. 12/26/18   Felecia Shelling, DPM  colchicine 0.6 MG tablet TAKE 1.2MG  (2 TABLETS) THEN 0.6MG  (1 TABLET) 1 HOUR AFTER. THEN, TAKE 1 TABLET EVERY DAY FOR 7 DAYS. 07/05/20   McDonald, Rachelle Hora, DPM  colchicine 0.6 MG tablet Take 1.2mg  (2 tablets) then 0.6mg  (1 tablet) 1 hour after. Then, take 1 tablet every day for 7 days. 06/28/20   Felecia Shelling, DPM  FLUoxetine (PROZAC) 20 MG capsule Take 1 capsule (20 mg total) by mouth daily. 01/20/15 01/20/16  Sharman Cheek, MD  FLUoxetine (PROZAC) 40 MG capsule Take by mouth daily. 05/07/17   [provider]  gabapentin (NEURONTIN) 100 MG capsule Take 1 capsule (100 mg total) by mouth 3 (three) times daily. 07/12/20   Felecia Shelling, DPM  gabapentin (NEURONTIN) 300 MG capsule Take 300 mg by mouth 3 (three) times daily. 08/03/20   [provider]  hydrochlorothiazide  (HYDRODIURIL) 25 MG tablet Take 25 mg by mouth daily.    [provider]  levocetirizine (XYZAL) 5 MG tablet Take 5 mg by mouth daily. 05/07/17   [provider]  levothyroxine (SYNTHROID) 150 MCG tablet Take 150 mcg by mouth daily. 10/09/18   [provider]  levothyroxine (SYNTHROID, LEVOTHROID) 137 MCG tablet Take 137 mcg by mouth daily before breakfast.    [provider]  lisinopril (PRINIVIL,ZESTRIL) 40 MG tablet Take 40 mg by mouth daily.    [provider]  LORazepam (ATIVAN) 0.5 MG tablet SMARTSIG:1 Tablet(s) By Mouth As Needed 10/13/18   [provider]  meloxicam (MOBIC) 15 MG tablet Take 1 tablet (15 mg total) by mouth daily. 12/26/18   Felecia Shelling, DPM  metFORMIN (GLUCOPHAGE) 500 MG tablet Take 500 mg by mouth 2 (two) times daily. 10/09/18   [provider]  metFORMIN (GLUCOPHAGE) 850 MG tablet Take 850 mg by mouth 2 (two) times daily. 08/03/20   [provider]  oxyCODONE-acetaminophen (PERCOCET) 5-325 MG tablet Take 1 tablet by mouth every 8 (eight) hours as needed for severe pain. 07/19/20   Felecia Shelling, DPM    Allergies Patient has no known allergies.  History reviewed. No pertinent family history.  Social History Social History   Tobacco Use   Smoking status: Never   Smokeless tobacco: Never  Substance Use Topics   Alcohol use: Yes   Drug  use: No    Review of Systems  Constitutional: Negative for fever. Eyes: Negative for visual changes. ENT: Negative for sore throat. Cardiovascular: Negative for chest pain. Respiratory: Negative for shortness of breath. Gastrointestinal: Negative for abdominal pain, vomiting and diarrhea. Genitourinary: Negative for dysuria. Musculoskeletal: Negative for back pain. Skin: Negative for rash.  Left pinky laceration Neurological: Negative for headaches, focal weakness or numbness. ____________________________________________  PHYSICAL EXAM:  VITAL SIGNS: ED  Triage Vitals [01/08/21 1331]  Enc Vitals Group     BP (!) 178/101     Pulse Rate 81     Resp 20     Temp 97.6 F (36.4 C)     Temp Source Oral     SpO2 96 %     Weight 199 lb 15.3 oz (90.7 kg)     Height 5\' 6"  (1.676 m)     Head Circumference      Peak Flow      Pain Score 1     Pain Loc      Pain Edu?      Excl. in GC?     Constitutional: Alert and oriented. Well appearing and in no distress. Head: Normocephalic and atraumatic. Eyes: Conjunctivae are normal. Normal extraocular movements Cardiovascular: Normal rate, regular rhythm. Normal distal pulses. Respiratory: Normal respiratory effort. No wheezes/rales/rhonchi. Gastrointestinal: Soft and nontender. No distention. Musculoskeletal: Normal composite fist on the left.  Patient with a superficial skin laceration of the skin of the proximal phalanx.  No nail avulsion is appreciated.  Nontender with normal range of motion in all extremities.  Neurologic:  Normal gait without ataxia. Normal speech and language. No gross focal neurologic deficits are appreciated. Skin:  Skin is warm, dry and intact. No rash noted. Psychiatric: Mood and affect are normal. Patient exhibits appropriate insight and judgment. ____________________________________________    {LABS (pertinent positives/negatives)  ____________________________________________  {EKG  ____________________________________________   RADIOLOGY Official radiology report(s): No results found. ____________________________________________  PROCEDURES   . Laceration Repair  Date/Time: 01/08/2021 2:46 PM Performed by: 14/05/2020, PA-C Authorized by: Lissa Hoard, PA-C   Consent:    Consent obtained:  Verbal   Consent given by:  Patient   Risks, benefits, and alternatives were discussed: yes     Risks discussed:  Infection and poor wound healing   Alternatives discussed:  No treatment Universal protocol:    Site/side marked: yes      Patient identity confirmed:  Verbally with patient Anesthesia:    Anesthesia method:  Local infiltration and topical application   Topical anesthetic:  LET Laceration details:    Location:  Finger   Finger location:  L small finger   Length (cm):  1.5   Depth (mm):  4 Pre-procedure details:    Preparation:  Patient was prepped and draped in usual sterile fashion Exploration:    Limited defect created (wound extended): no     Hemostasis achieved with:  LET   Contaminated: no   Treatment:    Area cleansed with:  Saline   Amount of cleaning:  Standard   Irrigation solution:  Sterile saline   Irrigation volume:  10   Irrigation method:  Syringe   Visualized foreign bodies/material removed: no     Debridement:  None   Undermining:  None   Scar revision: no   Skin repair:    Repair method:  Tissue adhesive Approximation:    Approximation:  Close Repair type:    Repair type:  Simple Post-procedure  details:    Dressing:  Non-adherent dressing   Procedure completion:  Tolerated well, no immediate complications ____________________________________________   INITIAL IMPRESSION / ASSESSMENT AND PLAN / ED COURSE  As part of my medical decision making, I reviewed the following data within the electronic MEDICAL RECORD NUMBER Notes from prior ED visits   Patient ED evaluation and management of accidental shave laceration to the left pinky.  Patient presents in no acute distress with an accidental laceration to the lateral aspect of the pinky.  The wound is cleansed and the skin flap was reapproximated and Dermabond is applied patient discharged in no acute distress.  Whitney Gilbert was evaluated in Emergency Department on 01/08/2021 for the symptoms described in the history of present illness. She was evaluated in the context of the global COVID-19 pandemic, which necessitated consideration that the patient might be at risk for infection with the SARS-CoV-2 virus that causes COVID-19.  Institutional protocols and algorithms that pertain to the evaluation of patients at risk for COVID-19 are in a state of rapid change based on information released by regulatory bodies including the CDC and federal and state organizations. These policies and algorithms were followed during the patient's care in the ED. ____________________________________________  FINAL CLINICAL IMPRESSION(S) / ED DIAGNOSES  Final diagnoses:  Laceration of left little finger without foreign body without damage to nail, initial encounter      Lissa Hoard, PA-C 01/08/21 1948    Concha Se, MD 01/09/21 [phone]

## 2021-01-08 NOTE — ED Triage Notes (Signed)
Pt comes into the ED via POV c/o laceration to the right pinky finger.  Pt in NAD at this time but does present anxious currently.  Pt states she was cutting a piece of wire with scissors because she couldn't find her wire cutters.  Pt currently has all bleeding under control.

## 2021-01-08 NOTE — Discharge Instructions (Addendum)
Keep the wound clean, dry, and covered.  °

## 2021-01-08 NOTE — ED Notes (Signed)
Dc ppw provided to patient. Followup information provided. RX information given. Questions answered. Pt provides verbal consent for discharge. VS declined at dc. Pt assisted off unit. 

## 2021-02-15 DIAGNOSIS — E039 Hypothyroidism, unspecified: Secondary | ICD-10-CM | POA: Diagnosis not present

## 2021-02-15 DIAGNOSIS — E7800 Pure hypercholesterolemia, unspecified: Secondary | ICD-10-CM | POA: Diagnosis not present

## 2021-02-15 DIAGNOSIS — E1140 Type 2 diabetes mellitus with diabetic neuropathy, unspecified: Secondary | ICD-10-CM | POA: Diagnosis not present

## 2021-02-15 DIAGNOSIS — E559 Vitamin D deficiency, unspecified: Secondary | ICD-10-CM | POA: Diagnosis not present

## 2021-02-15 DIAGNOSIS — Z1231 Encounter for screening mammogram for malignant neoplasm of breast: Secondary | ICD-10-CM | POA: Diagnosis not present

## 2021-06-15 DIAGNOSIS — E7800 Pure hypercholesterolemia, unspecified: Secondary | ICD-10-CM | POA: Diagnosis not present

## 2021-06-15 DIAGNOSIS — F1021 Alcohol dependence, in remission: Secondary | ICD-10-CM | POA: Diagnosis not present

## 2021-06-15 DIAGNOSIS — E039 Hypothyroidism, unspecified: Secondary | ICD-10-CM | POA: Diagnosis not present

## 2021-06-15 DIAGNOSIS — Z1231 Encounter for screening mammogram for malignant neoplasm of breast: Secondary | ICD-10-CM | POA: Diagnosis not present

## 2021-06-15 DIAGNOSIS — E1140 Type 2 diabetes mellitus with diabetic neuropathy, unspecified: Secondary | ICD-10-CM | POA: Diagnosis not present

## 2021-06-15 DIAGNOSIS — I10 Essential (primary) hypertension: Secondary | ICD-10-CM | POA: Diagnosis not present

## 2021-06-15 DIAGNOSIS — E559 Vitamin D deficiency, unspecified: Secondary | ICD-10-CM | POA: Diagnosis not present

## 2021-06-15 DIAGNOSIS — N1831 Chronic kidney disease, stage 3a: Secondary | ICD-10-CM | POA: Diagnosis not present

## 2021-06-21 DIAGNOSIS — Z1231 Encounter for screening mammogram for malignant neoplasm of breast: Secondary | ICD-10-CM | POA: Diagnosis not present

## 2021-10-17 DIAGNOSIS — N182 Chronic kidney disease, stage 2 (mild): Secondary | ICD-10-CM | POA: Diagnosis not present

## 2021-10-17 DIAGNOSIS — E1140 Type 2 diabetes mellitus with diabetic neuropathy, unspecified: Secondary | ICD-10-CM | POA: Diagnosis not present

## 2021-10-17 DIAGNOSIS — Z1331 Encounter for screening for depression: Secondary | ICD-10-CM | POA: Diagnosis not present

## 2021-10-17 DIAGNOSIS — E039 Hypothyroidism, unspecified: Secondary | ICD-10-CM | POA: Diagnosis not present

## 2021-10-17 DIAGNOSIS — I10 Essential (primary) hypertension: Secondary | ICD-10-CM | POA: Diagnosis not present

## 2021-10-17 DIAGNOSIS — E7800 Pure hypercholesterolemia, unspecified: Secondary | ICD-10-CM | POA: Diagnosis not present

## 2021-10-17 DIAGNOSIS — E559 Vitamin D deficiency, unspecified: Secondary | ICD-10-CM | POA: Diagnosis not present

## 2021-10-17 DIAGNOSIS — F1021 Alcohol dependence, in remission: Secondary | ICD-10-CM | POA: Diagnosis not present

## 2021-10-17 DIAGNOSIS — F419 Anxiety disorder, unspecified: Secondary | ICD-10-CM | POA: Diagnosis not present

## 2021-11-01 DIAGNOSIS — R7401 Elevation of levels of liver transaminase levels: Secondary | ICD-10-CM | POA: Diagnosis not present

## 2022-02-03 DIAGNOSIS — Z20822 Contact with and (suspected) exposure to covid-19: Secondary | ICD-10-CM | POA: Diagnosis not present

## 2022-02-03 DIAGNOSIS — J09X2 Influenza due to identified novel influenza A virus with other respiratory manifestations: Secondary | ICD-10-CM | POA: Diagnosis not present

## 2022-02-03 DIAGNOSIS — R059 Cough, unspecified: Secondary | ICD-10-CM | POA: Diagnosis not present

## 2022-02-19 DIAGNOSIS — Z23 Encounter for immunization: Secondary | ICD-10-CM | POA: Diagnosis not present

## 2022-02-19 DIAGNOSIS — M109 Gout, unspecified: Secondary | ICD-10-CM | POA: Diagnosis not present

## 2022-02-19 DIAGNOSIS — R69 Illness, unspecified: Secondary | ICD-10-CM | POA: Diagnosis not present

## 2022-02-19 DIAGNOSIS — E210 Primary hyperparathyroidism: Secondary | ICD-10-CM | POA: Diagnosis not present

## 2022-02-19 DIAGNOSIS — F419 Anxiety disorder, unspecified: Secondary | ICD-10-CM | POA: Diagnosis not present

## 2022-02-19 DIAGNOSIS — E559 Vitamin D deficiency, unspecified: Secondary | ICD-10-CM | POA: Diagnosis not present

## 2022-02-19 DIAGNOSIS — N182 Chronic kidney disease, stage 2 (mild): Secondary | ICD-10-CM | POA: Diagnosis not present

## 2022-02-19 DIAGNOSIS — E7800 Pure hypercholesterolemia, unspecified: Secondary | ICD-10-CM | POA: Diagnosis not present

## 2022-02-19 DIAGNOSIS — E039 Hypothyroidism, unspecified: Secondary | ICD-10-CM | POA: Diagnosis not present

## 2022-02-19 DIAGNOSIS — I10 Essential (primary) hypertension: Secondary | ICD-10-CM | POA: Diagnosis not present

## 2022-02-19 DIAGNOSIS — E1140 Type 2 diabetes mellitus with diabetic neuropathy, unspecified: Secondary | ICD-10-CM | POA: Diagnosis not present

## 2022-04-27 DIAGNOSIS — H5213 Myopia, bilateral: Secondary | ICD-10-CM | POA: Diagnosis not present

## 2022-04-27 DIAGNOSIS — Z0100 Encounter for examination of eyes and vision without abnormal findings: Secondary | ICD-10-CM | POA: Diagnosis not present

## 2022-06-22 DIAGNOSIS — E210 Primary hyperparathyroidism: Secondary | ICD-10-CM | POA: Diagnosis not present

## 2022-06-22 DIAGNOSIS — Z6830 Body mass index (BMI) 30.0-30.9, adult: Secondary | ICD-10-CM | POA: Diagnosis not present

## 2022-06-22 DIAGNOSIS — F419 Anxiety disorder, unspecified: Secondary | ICD-10-CM | POA: Diagnosis not present

## 2022-06-22 DIAGNOSIS — E039 Hypothyroidism, unspecified: Secondary | ICD-10-CM | POA: Diagnosis not present

## 2022-06-22 DIAGNOSIS — E559 Vitamin D deficiency, unspecified: Secondary | ICD-10-CM | POA: Diagnosis not present

## 2022-06-22 DIAGNOSIS — E7800 Pure hypercholesterolemia, unspecified: Secondary | ICD-10-CM | POA: Diagnosis not present

## 2022-06-22 DIAGNOSIS — M109 Gout, unspecified: Secondary | ICD-10-CM | POA: Diagnosis not present

## 2022-06-22 DIAGNOSIS — I10 Essential (primary) hypertension: Secondary | ICD-10-CM | POA: Diagnosis not present

## 2022-06-22 DIAGNOSIS — F1021 Alcohol dependence, in remission: Secondary | ICD-10-CM | POA: Diagnosis not present

## 2022-06-22 DIAGNOSIS — Z1231 Encounter for screening mammogram for malignant neoplasm of breast: Secondary | ICD-10-CM | POA: Diagnosis not present

## 2022-06-22 DIAGNOSIS — N1831 Chronic kidney disease, stage 3a: Secondary | ICD-10-CM | POA: Diagnosis not present

## 2022-06-22 DIAGNOSIS — E1140 Type 2 diabetes mellitus with diabetic neuropathy, unspecified: Secondary | ICD-10-CM | POA: Diagnosis not present

## 2022-08-02 DIAGNOSIS — Z1231 Encounter for screening mammogram for malignant neoplasm of breast: Secondary | ICD-10-CM | POA: Diagnosis not present

## 2022-08-22 ENCOUNTER — Telehealth: Payer: Self-pay

## 2022-08-22 NOTE — Patient Outreach (Signed)
  Care Coordination   Initial Visit Note   08/22/2022 Name: Whitney Gilbert MRN: [id number] DOB: Jan 15, 1946  Whitney Gilbert is a 77 y.o. year old female who sees Hamrick, Durward Fortes, MD for primary care. I spoke with  Otho Bellows by phone today.  What matters to the patients health and wellness today?  Placed call to patient today to review and offer [facility] care coordination program. Patient reports that she is doing well and denies any needs today.    SDOH assessments and interventions completed:  No     Care Coordination Interventions:  No, not indicated   Follow up plan: No further intervention required.   Encounter Outcome:  Pt. Refused   Rowe Pavy, RN, BSN, CEN [facility] [phone]

## 2022-10-24 DIAGNOSIS — E039 Hypothyroidism, unspecified: Secondary | ICD-10-CM | POA: Diagnosis not present

## 2022-10-24 DIAGNOSIS — N182 Chronic kidney disease, stage 2 (mild): Secondary | ICD-10-CM | POA: Diagnosis not present

## 2022-10-24 DIAGNOSIS — I10 Essential (primary) hypertension: Secondary | ICD-10-CM | POA: Diagnosis not present

## 2022-10-24 DIAGNOSIS — E210 Primary hyperparathyroidism: Secondary | ICD-10-CM | POA: Diagnosis not present

## 2022-10-24 DIAGNOSIS — E1140 Type 2 diabetes mellitus with diabetic neuropathy, unspecified: Secondary | ICD-10-CM | POA: Diagnosis not present

## 2022-10-24 DIAGNOSIS — Z139 Encounter for screening, unspecified: Secondary | ICD-10-CM | POA: Diagnosis not present

## 2022-10-24 DIAGNOSIS — E559 Vitamin D deficiency, unspecified: Secondary | ICD-10-CM | POA: Diagnosis not present

## 2022-10-24 DIAGNOSIS — E7800 Pure hypercholesterolemia, unspecified: Secondary | ICD-10-CM | POA: Diagnosis not present

## 2022-10-24 DIAGNOSIS — Z9181 History of falling: Secondary | ICD-10-CM | POA: Diagnosis not present

## 2022-10-24 DIAGNOSIS — Z79899 Other long term (current) drug therapy: Secondary | ICD-10-CM | POA: Diagnosis not present

## 2022-10-24 DIAGNOSIS — Z1331 Encounter for screening for depression: Secondary | ICD-10-CM | POA: Diagnosis not present

## 2022-10-24 DIAGNOSIS — M109 Gout, unspecified: Secondary | ICD-10-CM | POA: Diagnosis not present

## 2023-07-11 DIAGNOSIS — E210 Primary hyperparathyroidism: Secondary | ICD-10-CM | POA: Diagnosis not present

## 2023-07-11 DIAGNOSIS — E559 Vitamin D deficiency, unspecified: Secondary | ICD-10-CM | POA: Diagnosis not present

## 2023-07-11 DIAGNOSIS — F1020 Alcohol dependence, uncomplicated: Secondary | ICD-10-CM | POA: Diagnosis not present

## 2023-07-11 DIAGNOSIS — M109 Gout, unspecified: Secondary | ICD-10-CM | POA: Diagnosis not present

## 2023-07-11 DIAGNOSIS — N1831 Chronic kidney disease, stage 3a: Secondary | ICD-10-CM | POA: Diagnosis not present

## 2023-07-11 DIAGNOSIS — E7800 Pure hypercholesterolemia, unspecified: Secondary | ICD-10-CM | POA: Diagnosis not present

## 2023-07-11 DIAGNOSIS — F419 Anxiety disorder, unspecified: Secondary | ICD-10-CM | POA: Diagnosis not present

## 2023-07-11 DIAGNOSIS — E039 Hypothyroidism, unspecified: Secondary | ICD-10-CM | POA: Diagnosis not present

## 2023-07-11 DIAGNOSIS — Z79899 Other long term (current) drug therapy: Secondary | ICD-10-CM | POA: Diagnosis not present

## 2023-07-11 DIAGNOSIS — E1140 Type 2 diabetes mellitus with diabetic neuropathy, unspecified: Secondary | ICD-10-CM | POA: Diagnosis not present

## 2023-07-11 DIAGNOSIS — I10 Essential (primary) hypertension: Secondary | ICD-10-CM | POA: Diagnosis not present

## 2023-07-11 DIAGNOSIS — Z1231 Encounter for screening mammogram for malignant neoplasm of breast: Secondary | ICD-10-CM | POA: Diagnosis not present

## 2023-08-29 IMAGING — CT CT HEAD W/O CM
4 series · 14 of 47 positions shown, 16 images · non-contrast
Comparison: None.

CLINICAL DATA: Worsening bilateral extremity neuropathy for 3-4
years. Left jaw tremor.

EXAM:
CT HEAD WITHOUT CONTRAST
TECHNIQUE: Contiguous axial images were obtained from the base of the skull
through the vertex without intravenous contrast.

[Series 2: axial st head 5.00 ax · axial · 0.34mm/px · z∈[-575,-477]mm · 6 of 28 slices shown, 8 images]
[im 4/28  brain]
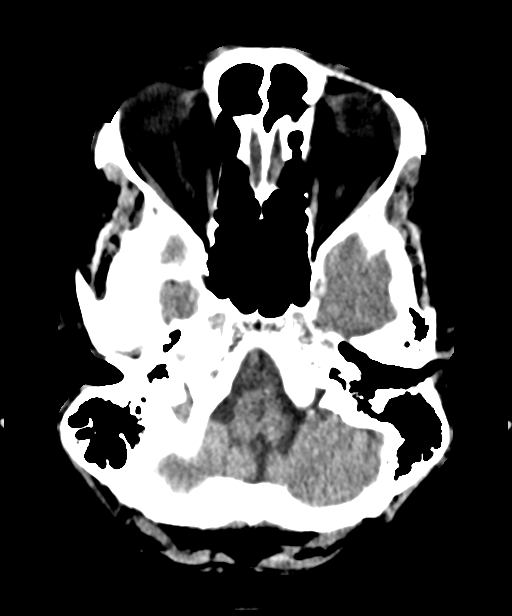
[im 4/28  bone]
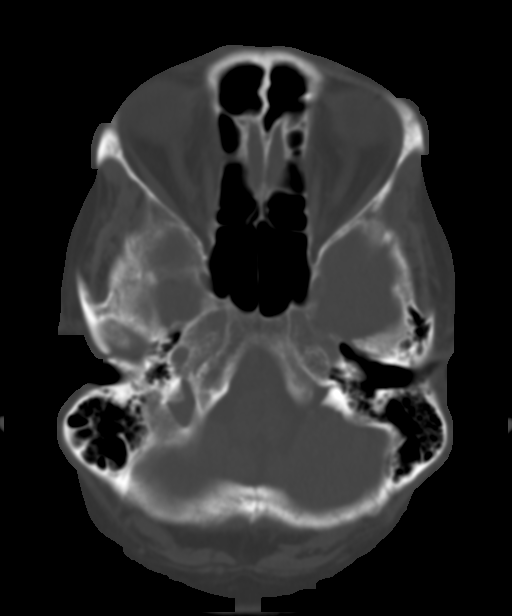
[im 8/28  brain]
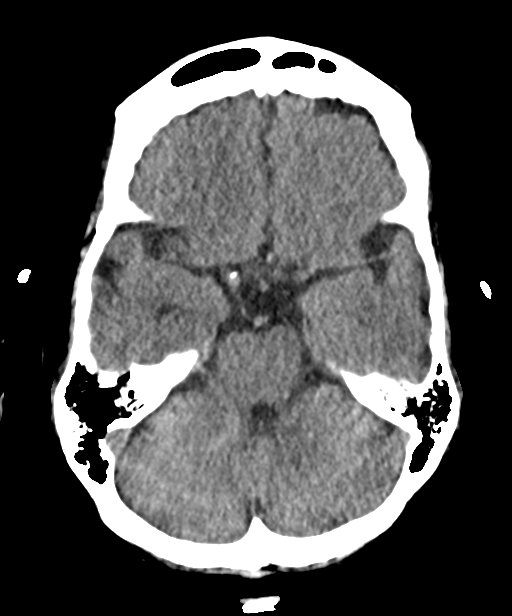
[im 12/28  brain]
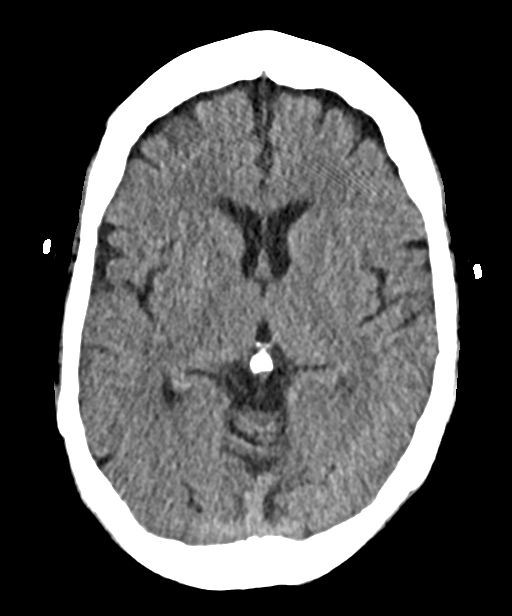
[im 16/28  brain]
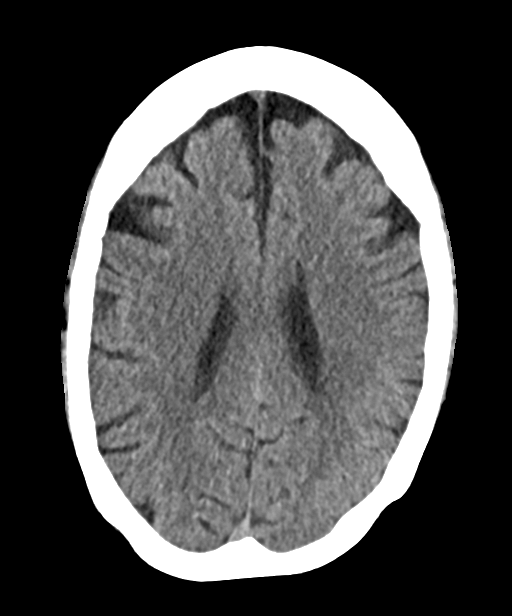
[im 20/28  brain]
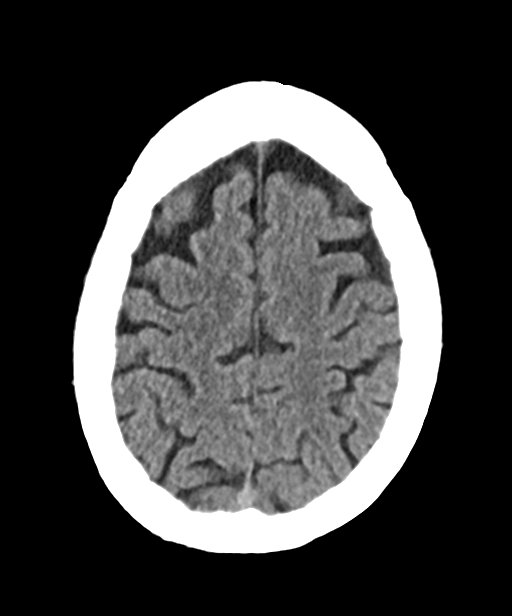
[im 20/28  bone]
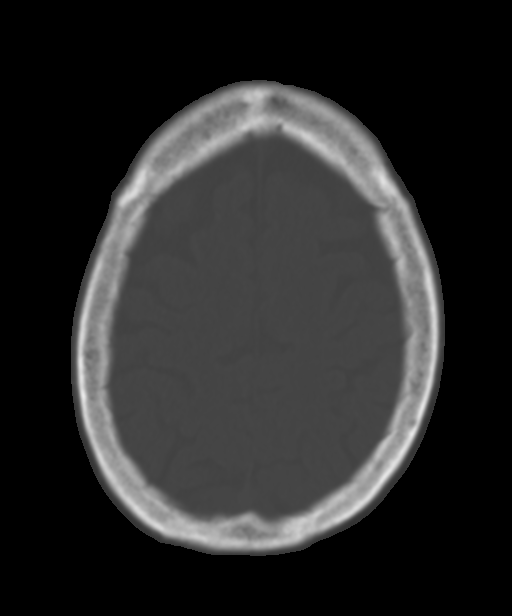
[im 24/28  brain]
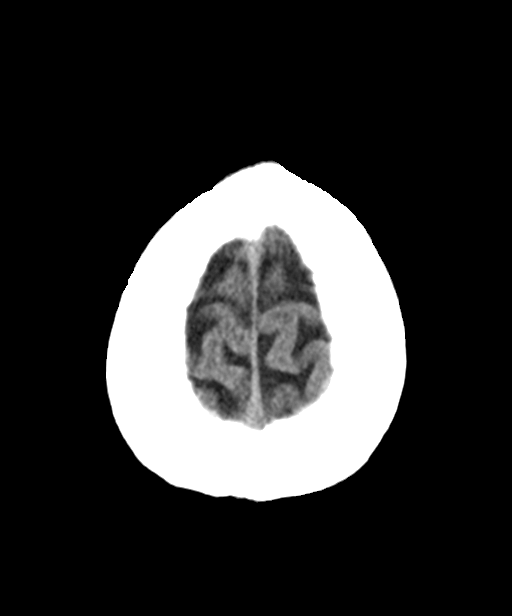

[Series 4: bone windows head 2.00 ax · axial · 0.34mm/px · z∈[-579,-566]mm · 2 of 72 slices shown]
[im 7/72  bone]
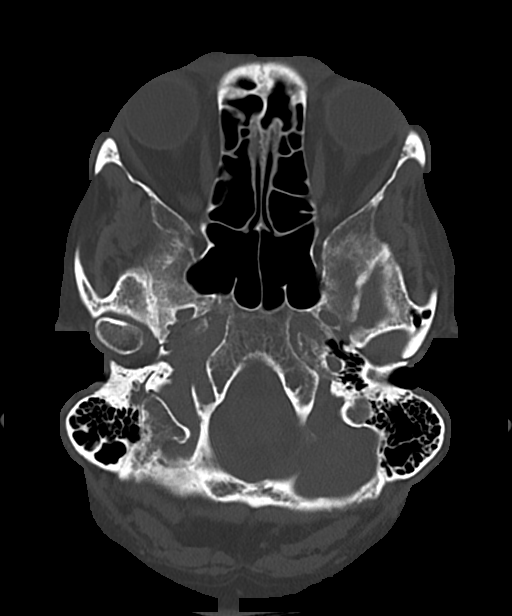
[im 14/72  bone]
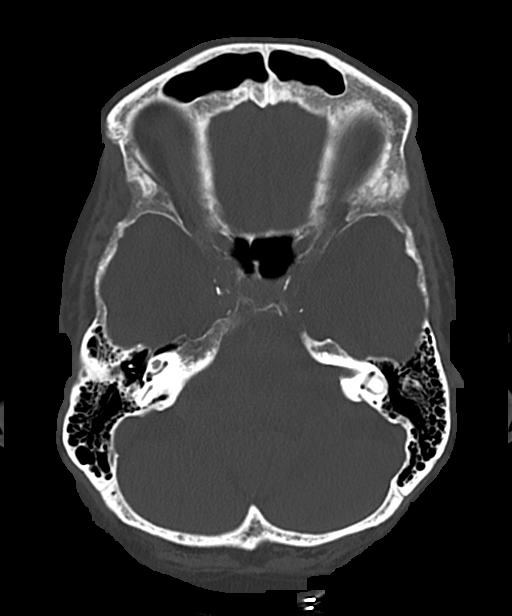

[Series 6: coronals head 3.00 cor · coronal · 0.28mm/px · 3 of 69 slices shown]
[im 23/69  brain]
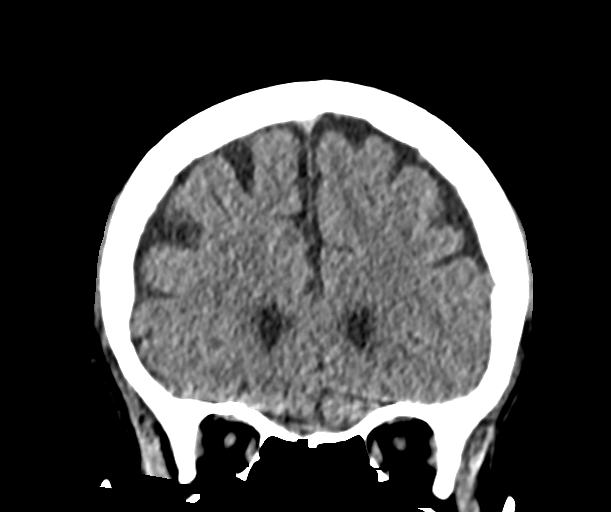
[im 31/69  brain]
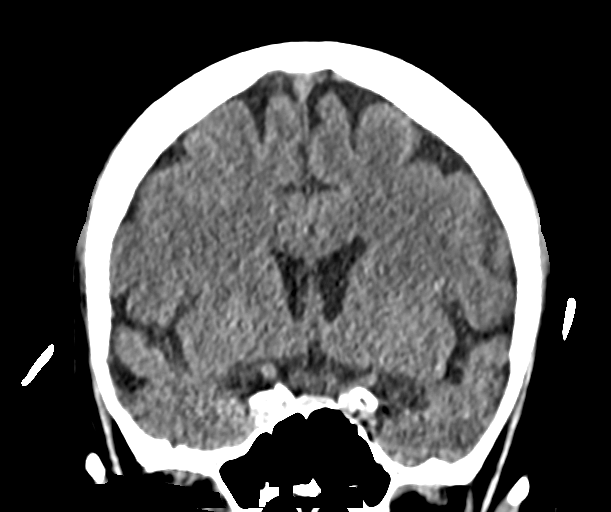
[im 38/69  brain]
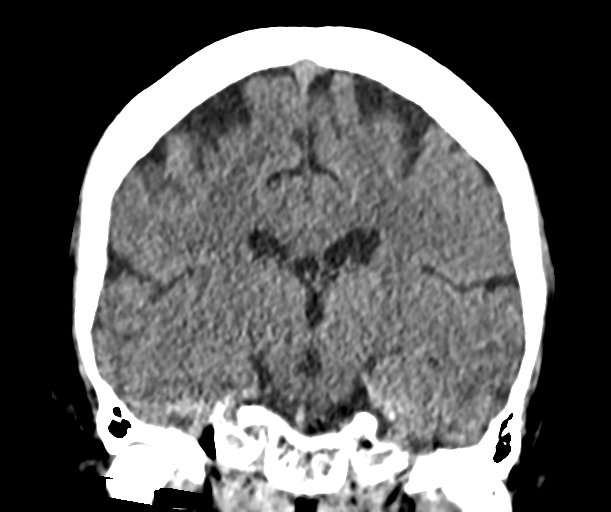

[Series 8: sagittals head 3.00 sag · sagittal · 0.28mm/px · 3 of 57 slices shown]
[im 22/57  brain]
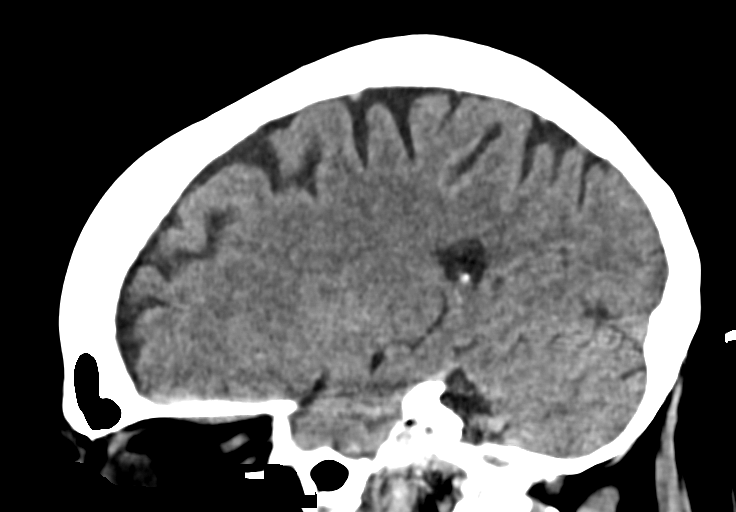
[im 29/57  brain]
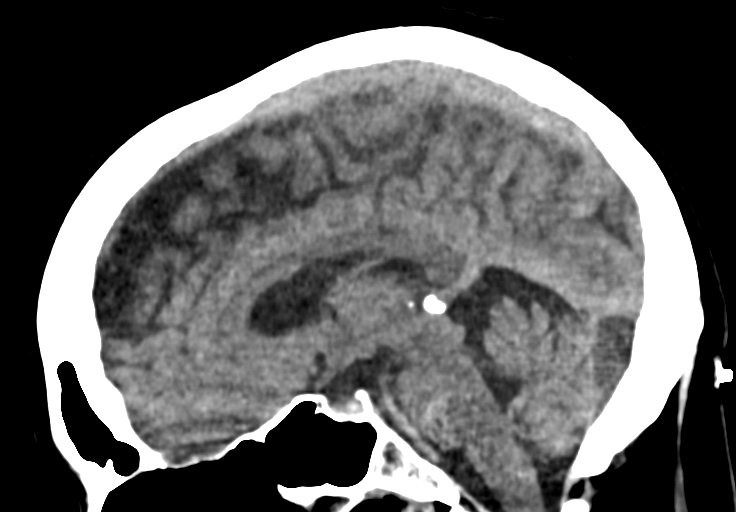
[im 35/57  brain]
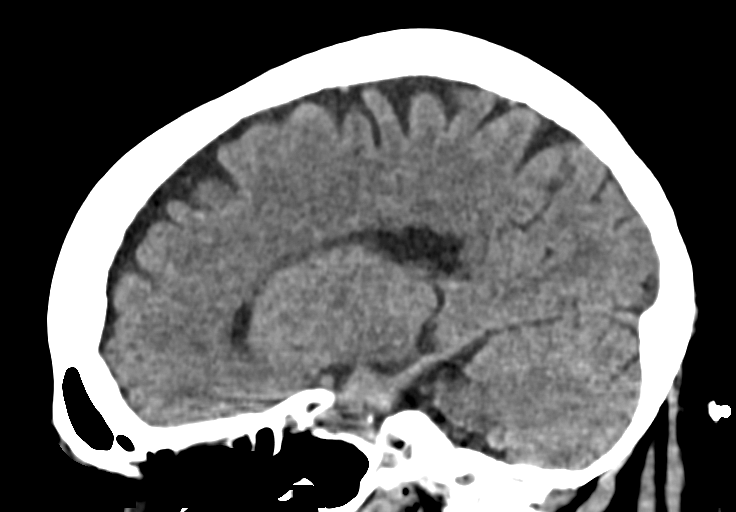

[14 of 47 positions shown; findings below may reference images not displayed]

FINDINGS: Brain: No evidence of acute infarction, hemorrhage, hydrocephalus,
extra-axial collection or mass lesion/mass effect. Cerebral volume
loss in keeping with age.

Vascular: No hyperdense vessel or unexpected calcification.

Skull: Normal. Negative for fracture or focal lesion.

Sinuses/Orbits: No acute finding.
IMPRESSION: Unremarkable head CT for age

## 2023-10-21 DIAGNOSIS — Z0100 Encounter for examination of eyes and vision without abnormal findings: Secondary | ICD-10-CM | POA: Diagnosis not present

## 2023-10-21 DIAGNOSIS — H5213 Myopia, bilateral: Secondary | ICD-10-CM | POA: Diagnosis not present

## 2024-03-02 NOTE — Progress Notes (Signed)
 Whitney Gilbert                                          MRN: [id number]   03/02/2024   The [facility] Quality Team Specialist reviewed this patient medical record for the purposes of chart review for care gap closure. The following were reviewed: chart review for care gap closure-kidney health evaluation for diabetes:eGFR  and uACR.    [facility] Quality Team
